# Patient Record
Sex: Male | Born: 1975 | Race: White | Hispanic: No | Marital: Single | Smoking: Current every day smoker
Health system: Southern US, Community
[De-identification: ages and names within clinical notes are randomized; demographics above are authoritative.]

---

## 2017-07-09 ENCOUNTER — Encounter (HOSPITAL_COMMUNITY): Payer: Self-pay | Admitting: Family Medicine

## 2017-07-09 ENCOUNTER — Emergency Department (HOSPITAL_COMMUNITY)
Admission: EM | Admit: 2017-07-09 | Discharge: 2017-07-09 | Disposition: A | Discharge status: Home / Self Care | Payer: Self-pay | Attending: Emergency Medicine | Admitting: Emergency Medicine

## 2017-07-09 ENCOUNTER — Emergency Department (HOSPITAL_COMMUNITY): Payer: Self-pay

## 2017-07-09 ENCOUNTER — Other Ambulatory Visit: Payer: Self-pay

## 2017-07-09 DIAGNOSIS — R030 Elevated blood-pressure reading, without diagnosis of hypertension: Secondary | ICD-10-CM

## 2017-07-09 DIAGNOSIS — S9031XA Contusion of right foot, initial encounter: Secondary | ICD-10-CM | POA: Insufficient documentation

## 2017-07-09 DIAGNOSIS — F17210 Nicotine dependence, cigarettes, uncomplicated: Secondary | ICD-10-CM | POA: Insufficient documentation

## 2017-07-09 DIAGNOSIS — Y999 Unspecified external cause status: Secondary | ICD-10-CM | POA: Insufficient documentation

## 2017-07-09 DIAGNOSIS — S9032XA Contusion of left foot, initial encounter: Secondary | ICD-10-CM | POA: Insufficient documentation

## 2017-07-09 DIAGNOSIS — Y929 Unspecified place or not applicable: Secondary | ICD-10-CM | POA: Insufficient documentation

## 2017-07-09 DIAGNOSIS — W19XXXA Unspecified fall, initial encounter: Secondary | ICD-10-CM

## 2017-07-09 DIAGNOSIS — Y939 Activity, unspecified: Secondary | ICD-10-CM | POA: Insufficient documentation

## 2017-07-09 DIAGNOSIS — W1789XA Other fall from one level to another, initial encounter: Secondary | ICD-10-CM | POA: Insufficient documentation

## 2017-07-09 DIAGNOSIS — S9030XA Contusion of unspecified foot, initial encounter: Secondary | ICD-10-CM

## 2017-07-09 NOTE — ED Triage Notes (Signed)
Patient reports on Sunday, he was jumping off a low level step/wall and fell. Patient reports he hit his head on the the wood step/wall with no LOC and experiencing bilateral ankle pain. Also, unable to bear weight on the right heel. Patient took TYLENOL for pain with last dose being last night.

## 2017-07-09 NOTE — ED Notes (Addendum)
Pt alert and oriented x 4 and is verbally responsive. Pt reports 8/10 pain to bilateral heels/ ankles pt has deformities/swelling  noted to the rt ankle. Pt reports taking tylenol for pain yesterday, pt reports that he took no medications today. Pt has increased pain with ambulation and  Unsteady gait.

## 2017-07-09 NOTE — Discharge Instructions (Signed)
Rest, Ice intermittently (in the first 24-48 hours), Gentle compression with an Ace wrap, and elevate (Limb above the level of the heart)   Take up to 800mg  of ibuprofen (that is usually 4 over the counter pills)  3 times a day for 5 days. Take with food.  Please follow with your primary care doctor in the next 5 days for high blood pressure evaluation. If you do not have a primary care doctor, present to urgent care. Reduce salt intake. Seek emergency medical care for unilateral weakness, slurring, change in vision, or chest pain and shortness of breath.

## 2017-07-09 NOTE — ED Provider Notes (Signed)
**Eugene [name] De-Identified via Obfuscation** Eugene Eugene [name]   CSN: [id number] Arrival date & time: 07/09/17  1441     History   Chief Complaint Chief Complaint  Patient presents with  . Fall    HPI   Blood pressure (!) 147/115, pulse (!) 102, temperature 97.6 F (36.4 C), temperature source Oral, resp. rate 18, height 5\' 9"  (1.753 m), weight 98.9 kg (218 lb), SpO2 96 %.  Eugene Eugene [name] is a 41 y.o. male complaining of bilateral ankle and heel pain when he jumped off a small wall several days ago he fell backwards and hit his head, there is no loss of consciousness, is not anticoagulated.  No headache, change in his vision, dysarthria, ataxia, cervicalgia, chest pain, abdominal pain he states that the pain in his ankles and feet has been moderate and exacerbated by weightbearing he is taken Tylenol at home with little relief.  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  History reviewed. No pertinent past medical history.  There are no active problems to display for this patient.   History reviewed. No pertinent surgical history.     Home Medications    Prior to Admission medications   Not on File    Family History History reviewed. No pertinent family history.  Social History Social History   Tobacco Use  . Smoking status: Current Every Day Smoker    Packs/day: 0.50    Types: Cigarettes  . Smokeless tobacco: Never Used  Substance Use Topics  . Alcohol use: Yes    Comment: 2 times a week.   . Drug use: Yes    Types: Marijuana     Allergies   Patient has no allergy information on record.   Review of Systems Review of Systems  A complete review of systems was obtained and all systems are negative except as noted in the HPI and PMH.   Physical Exam Updated Vital Signs BP (!) 147/115 (BP Location: Left Arm)   Pulse (!) 102   Temp 97.6 F (36.4 C) (Oral)   Resp 18   Ht 5\' 9"  (1.753 m)   Wt 98.9 kg (218  lb)   SpO2 96%   BMI 32.19 kg/m   Physical Exam  Constitutional: He is oriented to person, place, and time. He appears well-developed and well-nourished. No distress.  HENT:  Head: Normocephalic and atraumatic.  Mouth/Throat: Oropharynx is clear and moist.  Eyes: Conjunctivae and EOM are normal. Pupils are equal, round, and reactive to light.  Neck: Normal range of motion.  Cardiovascular: Normal rate, regular rhythm and intact distal pulses.  Pulmonary/Chest: Effort normal and breath sounds normal. No stridor. No respiratory distress. He has no wheezes. He has no rales. He exhibits no tenderness.  Abdominal: Soft. He exhibits no distension and no mass. There is no tenderness. There is no rebound and no guarding. No hernia.  Musculoskeletal: Normal range of motion.  Mild swelling to bilateral ankles, patient is diffusely tender to palpation along the bilateral ankles on the medial and lateral aspect more significantly tender along the right heel.  Distally neurovascularly intact.  Neurological: He is alert and oriented to person, place, and time.  Skin: Capillary refill takes less than 2 seconds. He is not diaphoretic.  Psychiatric: He has a normal mood and affect.  Nursing Eugene [name] and vitals reviewed.    ED Treatments / Results  Labs (all labs ordered are listed, but only abnormal results are  displayed) Labs Reviewed - No data to display  EKG  EKG Interpretation None       Radiology Dg Ankle Complete Left  Result Date: 07/09/2017 CLINICAL DATA:  Pt jumped from a low step on Sunday, fell and landed on backs of his heels and hit his head. Unable to put weight on R heel, with swelling on both ankles and feet. No previous injuries EXAM: LEFT ANKLE COMPLETE - 3+ VIEW COMPARISON:  None. FINDINGS: No convincing acute fracture. There is a small well corticated bone fragment adjacent to the inferior margin of the distal fibula consistent with an old avulsion fracture or a secondary  ossification center. The ankle mortise is normally spaced and aligned. No significant arthropathic change. Soft tissues are unremarkable. IMPRESSION: No acute fracture or dislocation. Electronically Signed   By: Amie Portlandavid  Ormond M.D.   On: 07/09/2017 16:01   Dg Ankle Complete Right  Result Date: 07/09/2017 CLINICAL DATA:  Pt jumped from a low step on Sunday, fell and landed on backs of his heels and hit his head. Unable to put weight on R heel, with swelling on both ankles and feet. No previous injuries EXAM: RIGHT ANKLE - COMPLETE 3+ VIEW COMPARISON:  None. FINDINGS: No fracture.  No bone lesion. The ankle mortise is normally spaced and aligned. No significant arthropathic change. Soft tissues are unremarkable. IMPRESSION: Negative. Electronically Signed   By: Amie Portlandavid  Ormond M.D.   On: 07/09/2017 16:02    Procedures Procedures (including critical care time)  Medications Ordered in ED Medications - No data to display   Initial Impression / Assessment and Plan / ED Course  I have reviewed the triage vital signs and the nursing notes.  Pertinent labs & imaging results that were available during my care of the patient were reviewed by me and considered in my medical decision making (see chart for details).     Vitals:   07/09/17 1526  BP: (!) 147/115  Pulse: (!) 102  Resp: 18  Temp: 97.6 F (36.4 C)  TempSrc: Oral  SpO2: 96%  Weight: 98.9 kg (218 lb)  Height: 5\' 9"  (1.753 m)    Medications - No data to display  Eugene Eugene [name] is 41 y.o. male presenting with bilateral ankle and heel pain after slipping off a short wall several days ago.  Patient is ambulatory but with pain.  Worse on the right than the left, x-rays negative.  Will write patient work Eugene [name], give crutches, recommend rest, ice, compression and elevation.  Blood pressure mildly elevated today, advise close follow-up with primary care.  Declines pain medication in the ED.  Evaluation does not show pathology that would  require ongoing emergent intervention or inpatient treatment. Pt is hemodynamically stable and mentating appropriately. Discussed findings and plan with patient/guardian, who agrees with care plan. All questions answered. Return precautions discussed and outpatient follow up given.      Final Clinical Impressions(s) / ED Diagnoses   Final diagnoses:  Fall, initial encounter  Contusion of foot, unspecified laterality, initial encounter  Elevated blood pressure reading    ED Discharge Orders    None       Cerys Winget, Mardella Laymanicole, PA-C 07/09/17 [address] [id number]    Wentz, Elliott, MD 07/11/17 [phone]

## 2019-07-08 IMAGING — CR DG ANKLE COMPLETE 3+V*L*
3 series · 3 of 3 positions shown · non-contrast
Comparison: None.

CLINICAL DATA: Pt jumped from a low step on [REDACTED], fell and landed
on backs of his heels and hit his head. Unable to put weight on R
heel, with swelling on both ankles and feet. No previous injuries

EXAM:
LEFT ANKLE COMPLETE - 3+ VIEW

[x ankle ap left]
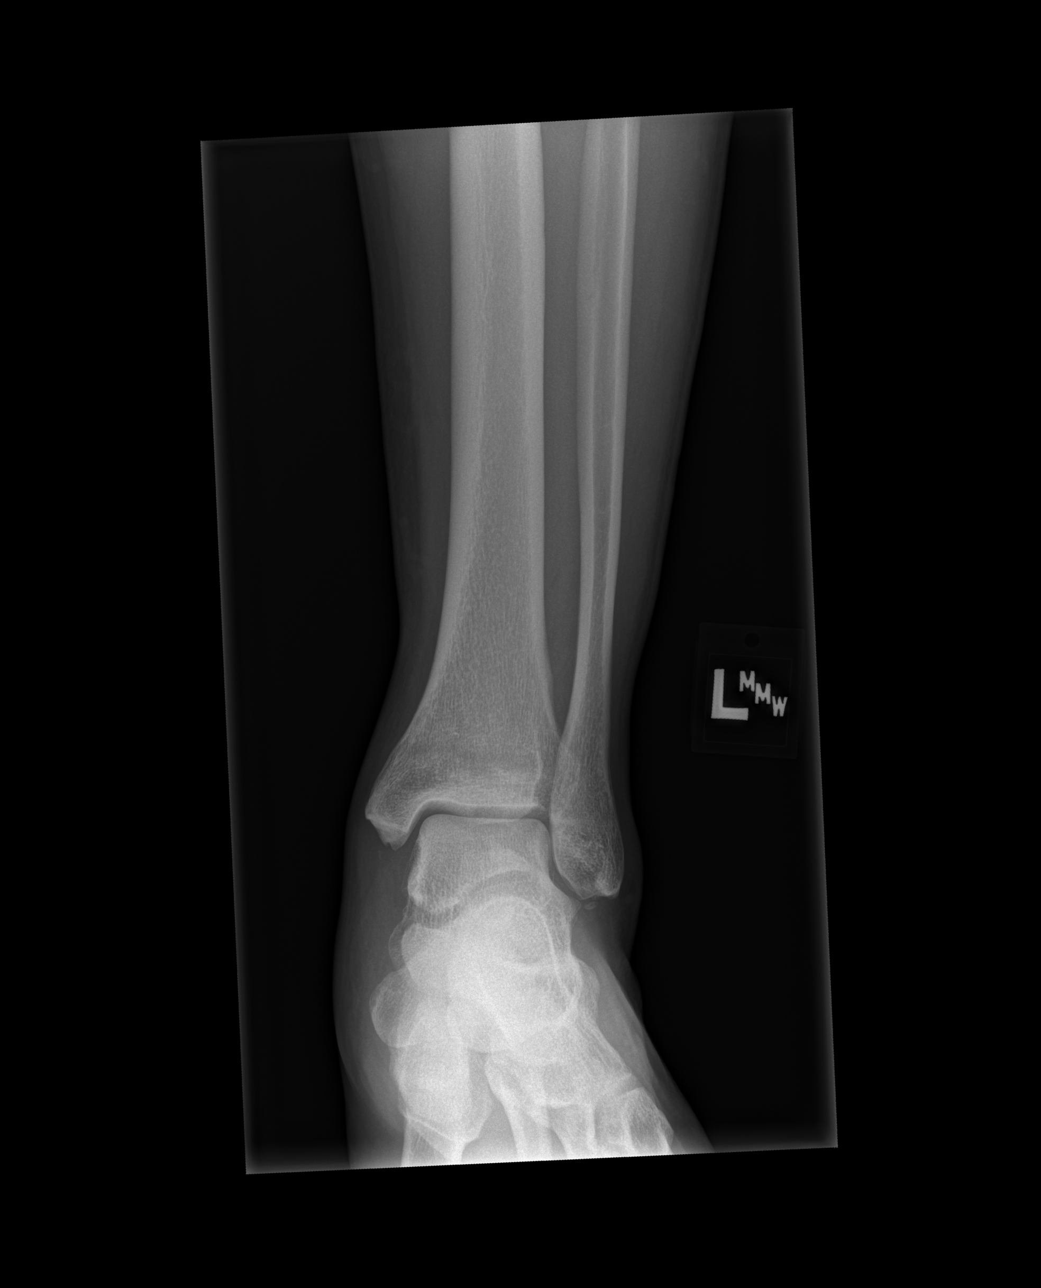

[x ankle obl left]
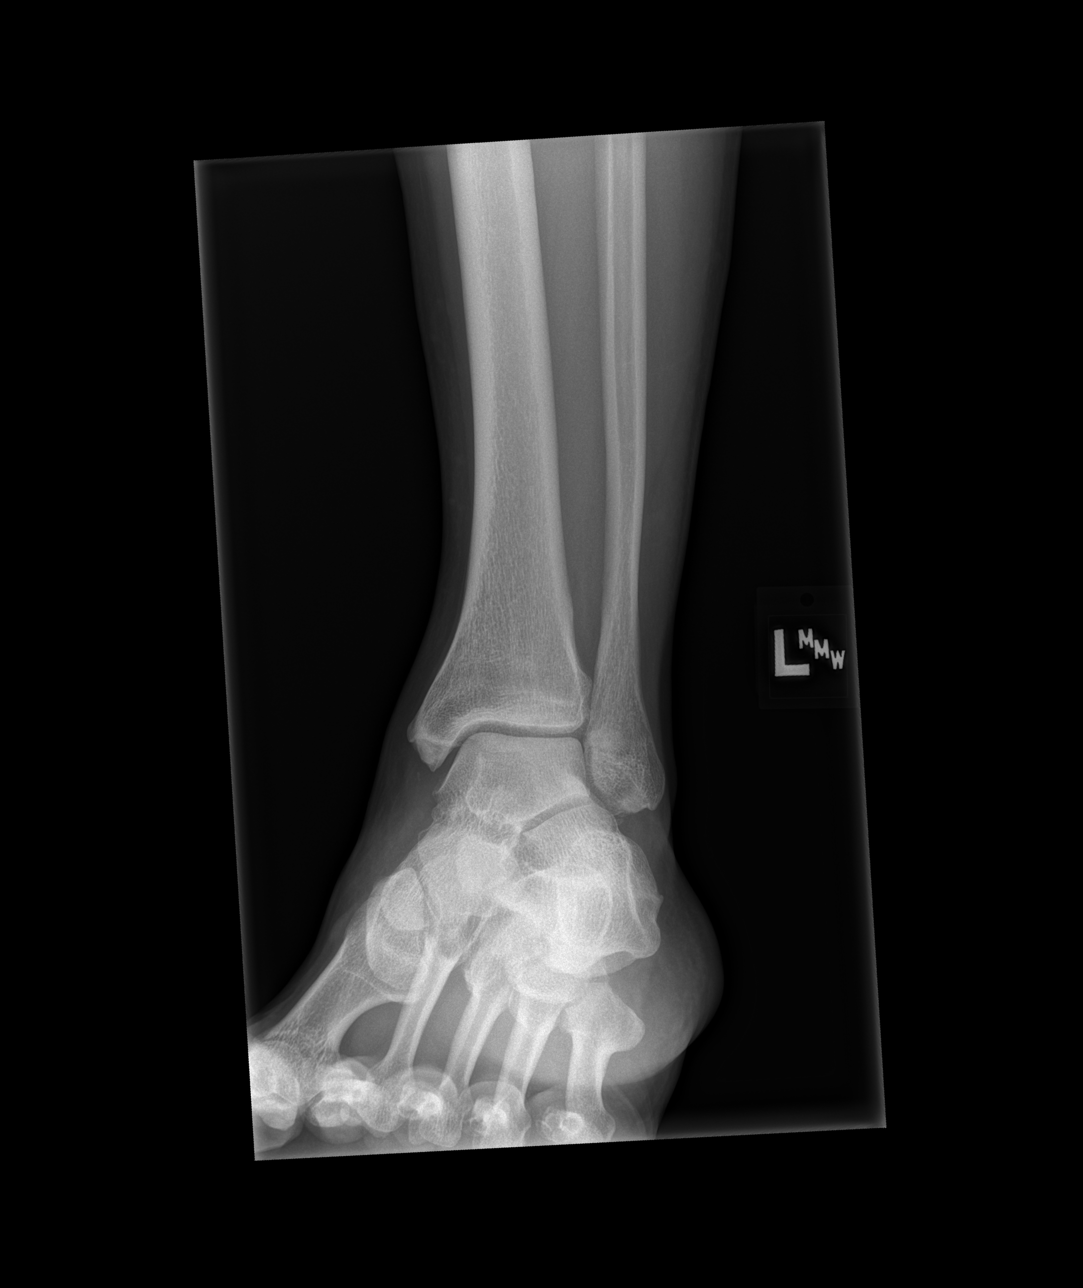

[x ankle lat left]
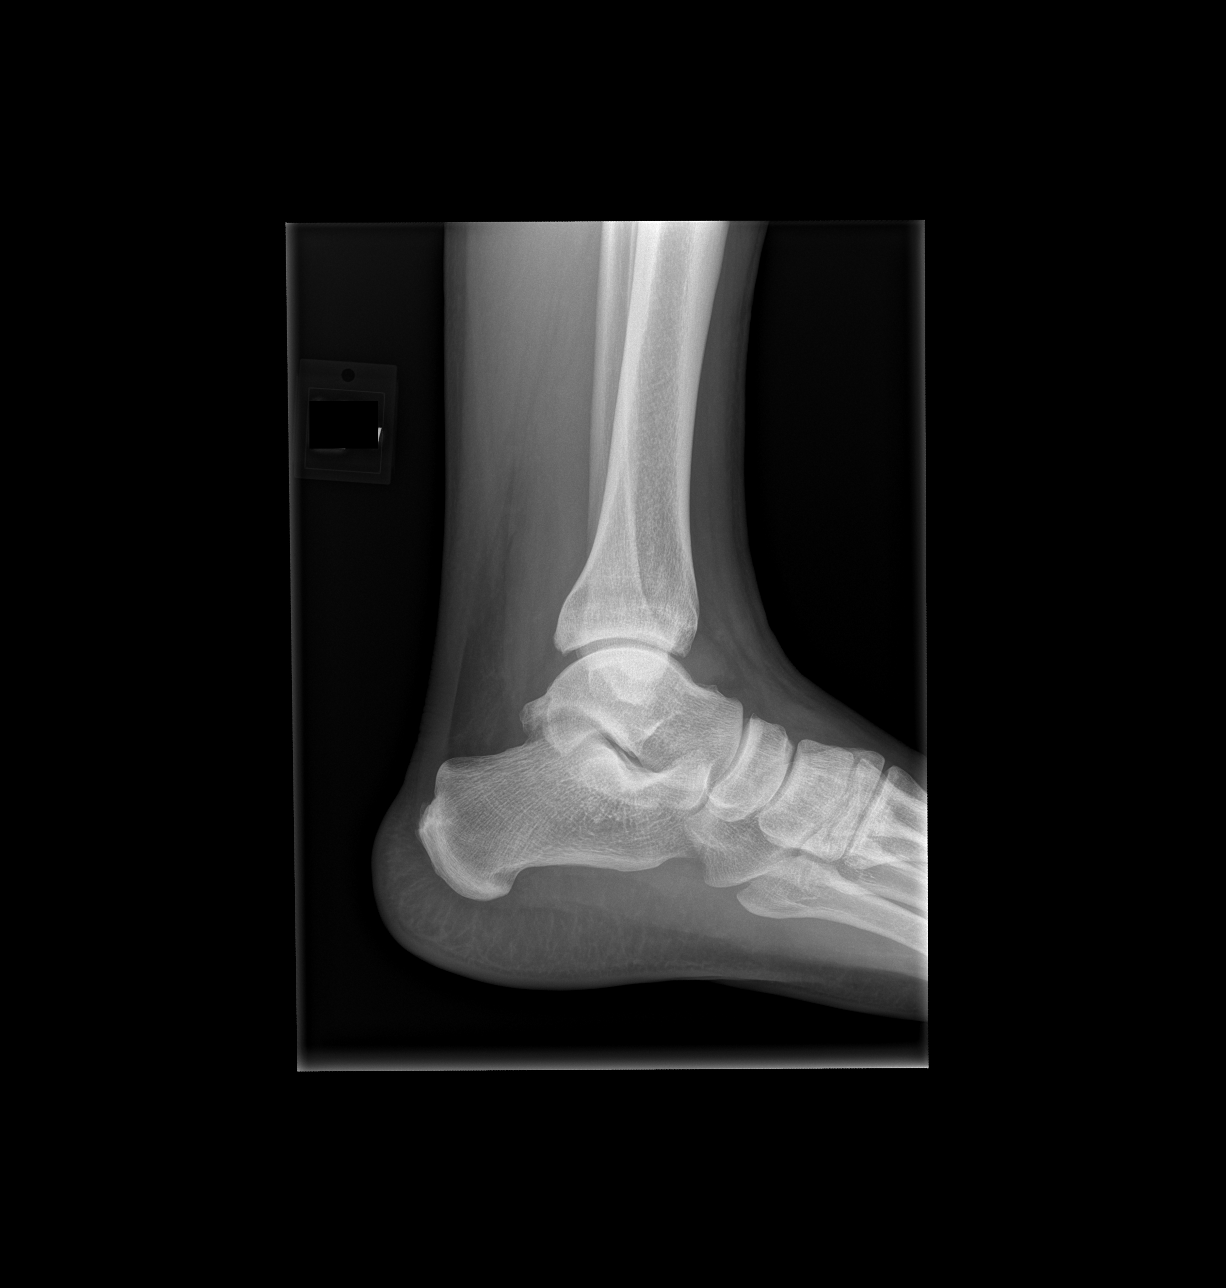

[3 of 3 positions shown; findings below may reference images not displayed]

FINDINGS: No convincing acute fracture. There is a small well corticated bone
fragment adjacent to the inferior margin of the distal fibula
consistent with an old avulsion fracture or a secondary ossification
center.

The ankle mortise is normally spaced and aligned. No significant
arthropathic change.

Soft tissues are unremarkable.
IMPRESSION: No acute fracture or dislocation.

## 2023-08-28 NOTE — Progress Notes (Signed)
 Lipids - low HDL, otherwise OK.  However, he is at a risk level where we would recommend a statin medication.  Is he OK if we send in one for him?  Also needs to focus on [facility] diet/exercise  The 10-year ASCVD risk score (Arnett DK, et al., 2019) is: 11.8%   Values used to calculate the score:     Age: 48 years     Sex: Male     Is Non-Hispanic African American: No     Diabetic: No     Tobacco smoker: Yes     Systolic Blood Pressure: 159 mmHg     Is BP treated: Yes     HDL Cholesterol: 31 mg/dL     Total Cholesterol: 151 mg/dL  CMP - slightly low sodium and chloride; use zero sugar electrolyte beverages to help bring this up.  Blood sugar mildly elevated.  Everything else OK  CBC - WBC count, hematocrit, neutrophils, and monocytes elevated; have him monitor for signs of infection, such as fever/chills/body aches.  However, these labs appear to be stable from previous.  Please mske sure he is also staying well hydrated.

## 2023-08-28 NOTE — Progress Notes (Addendum)
 [facility] [place]  Health Maintenance Visit Eugene [name] DOB: 09/09/1975  MRN: [id number] Visit Date: 08/28/2023  Encounter Provider: Bernardino JAYSON Level, FNP  HPI HEALTH MAINTENANCE: Eugene [name] presents for a health maintenance exam as a new patient.  Seasonal worker - goes to [place]  seasonally for work.  Works as a leisure centre manager.  Married - no kids SCREENING TESTS: ---Last physical exam: Over 1 year ago ---Last colonoscopy: Never - declines colonoscopy, but agreeable to Cologuard ---Last PSA: N/A ---Last dentist visit: Not recently, but has appt coming up ---Vision problems: Denies - does not see eye doctor ---Blood pressure problems: HTN - managed with medication LIFESTYLE:  ---Sleep hours: 8 hours, but not continuously  ---Seatbelt use: always ---Diet: Fair - decreasing fast food and sodium.  Eats smaller portions. ---Exercise: Yardwork - no regimen ---Alcohol: 4-5 beers twice a week ---Smoking/Vaping: 0.5 PPD - smoked about 1 PPD for over 30 years ---Caffeine: no coffee, 1 soda per day [facility]), no energy drinks LAB/IMMUNIZATIONS: ---Last lipid profile: Today  ---Last tetanus vaccine: Believes he had one 3 years - will check date. ---Last influenza vaccine: Declines ---COVID vaccine: Declines ---Last pneumococcal vaccine: N/A - due age 69 ---Shingrix vaccine: N/A - due age 36 MEDICATIONS: OTC Vitamins: B12 Other OTC Meds: Denies  Family history of prostate cancer: Denies.  Family history of colon cancer: Denies.  Family history of premature death from CVD: Father (age 54) and paternal grandfather (age 27)  Family history of hyperlipidemia or hypertension: Father's side of family (HTN and HLD).  HYPERTENSION MANAGEMENT: He is here for hypertension follow-up. SYMPTOMS: Pertinent negatives - chest pain, chest pressure/discomfort, claudication, dyspnea, exertional chest pressure/discomfort, fatigue, irregular heart beat, lower extremity  edema, near-syncope, orthopnea, palpitations, paroxysmal nocturnal dyspnea, syncope, and tachypnea HABITS: --Patient has been following a reduced sodium diet. -- He IS NOT getting adequate exercise. HOME BLOOD PRESSURE: DOES NOT check BP at home. MEDICATIONS: Losartan 100 mg daily (has been without medicine for 2 weeks - needs refill) RISK FACTORS: Smoking: 0.5 PPD (decreased from 1 PPD) - using Nicorette Gum Exercise: Yardwork - no specific regimen  Possible OSA - snoring and gasping for air throughout the night for a long time.  Never diagnosed with OSA formally.  Does not use CPAP.  Does have daytime fatigue.  He is interested in a sleep study.   ROS-  Review of Systems  Constitutional:  Positive for fatigue (daytime). Negative for activity change, appetite change, chills, diaphoresis and fever.  HENT:  Negative for congestion, ear pain, hearing loss, sinus pressure, sneezing, sore throat and trouble swallowing.   Eyes:  Negative for photophobia and visual disturbance.  Respiratory:  Negative for cough, chest tightness, shortness of breath and wheezing.   Cardiovascular:  Negative for chest pain, palpitations and leg swelling.  Gastrointestinal:  Negative for abdominal distention, abdominal pain, constipation, diarrhea, nausea and vomiting.  Endocrine: Negative for cold intolerance and heat intolerance.  Genitourinary:  Negative for difficulty urinating, dysuria and hematuria.  Musculoskeletal:  Negative for arthralgias, back pain, joint swelling and myalgias.  Neurological:  Negative for dizziness, light-headedness, numbness and headaches.  Hematological:  Negative for adenopathy. Does not bruise/bleed easily.  Psychiatric/Behavioral:  Positive for sleep disturbance (snoring). Negative for agitation, behavioral problems, confusion and decreased concentration. The patient is not nervous/anxious.     Medical History:  Past Medical History:  Diagnosis Date  . Hypertension     There  are no active problems to display for this patient.  Current Medications:  Current Outpatient Medications on File Prior to Visit  Medication Sig Dispense Refill  . aspirin 325 mg tablet Take 325 mg by mouth daily.    SABRA losartan (COZAAR) 50 mg tablet Take 100 mg by mouth daily.     No current facility-administered medications on file prior to visit.    Current Outpatient Medications:  .  aspirin 325 mg tablet, Take 325 mg by mouth daily., Disp: , Rfl:  .  losartan (COZAAR) 50 mg tablet, Take 100 mg by mouth daily., Disp: , Rfl:   Allergies: No Known Allergies   Immunizations:  There is no immunization history on file for this patient.  Surgical History- History reviewed. No pertinent surgical history.  Family history- No family history on file. Social history-  Social History   Socioeconomic History  . Marital status: Married    Spouse name: None  . Number of children: None  . Years of education: None  . Highest education level: None  Occupational History  . None  Tobacco Use  . Smoking status: Every Day    Current packs/day: 0.50    Types: Cigarettes  . Smokeless tobacco: Never  Substance and Sexual Activity  . Alcohol use: Yes  . Drug use: Yes    Types: Marijuana  . Sexual activity: None  Other Topics Concern  . None  Social History Narrative  . None   Social Drivers of Health   Food Insecurity: Low Risk  (08/28/2023)   Food vital sign   . Within the past 12 months, you worried that your food would run out before you got money to buy more: Never true   . Within the past 12 months, the food you bought just didn't last and you didn't have money to get more: Never true  Transportation Needs: No Transportation Needs (08/28/2023)   Transportation   . In the past 12 months, has lack of reliable transportation kept you from medical appointments, meetings, work or from getting things needed for daily living? : No  Safety: Low Risk  (08/28/2023)   Safety   . How often  does anyone, including family and friends, physically hurt you?: Never   . How often does anyone, including family and friends, insult or talk down to you?: Never   . How often does anyone, including family and friends, threaten you with harm?: Never   . How often does anyone, including family and friends, scream or curse at you?: Never  Living Situation: Low Risk  (08/28/2023)   Living Situation   . What is your living situation today?: I have a steady place to live   . Think about the place you live. Do you have problems with any of the following? Choose all that apply:: None/None on this list     Physical Exam: BP 159/89   Pulse (!) 116   Temp 97.9 F (36.6 C)   Resp 16   Ht 1.753 m (5' 9)   Wt 125 kg (274 lb 12.8 oz)   SpO2 94%   BMI 40.58 kg/m  Wt Readings from Last 3 Encounters:  08/28/23 125 kg (274 lb 12.8 oz)   Physical Exam Vitals and nursing note reviewed.  Constitutional:      Appearance: Normal appearance. He is obese.  HENT:     Head: Normocephalic and atraumatic.     Right Ear: Tympanic membrane, ear canal and external ear normal.     Left Ear: Tympanic membrane, ear canal and external  ear normal.     Nose: Nose normal.     Mouth/Throat:     Mouth: Mucous membranes are moist.     Pharynx: Oropharynx is clear.  Eyes:     Extraocular Movements: Extraocular movements intact.     Pupils: Pupils are equal, round, and reactive to light.  Neck:     Vascular: No carotid bruit.  Cardiovascular:     Rate and Rhythm: Normal rate and regular rhythm.     Pulses: Normal pulses.     Heart sounds: Normal heart sounds.  Pulmonary:     Effort: Pulmonary effort is normal.     Breath sounds: Wheezing (intermittent) and rhonchi (intermittent) present.  Abdominal:     General: Abdomen is flat. Bowel sounds are normal.     Palpations: Abdomen is soft.  Musculoskeletal:        General: Normal range of motion.     Cervical back: Normal range of motion and neck supple.      Comments: STRENGTH 5/5 BILATERALLY IN UPPER AND LOWER EXTREMITIES  Lymphadenopathy:     Cervical: No cervical adenopathy.  Skin:    General: Skin is warm and dry.  Neurological:     General: No focal deficit present.     Mental Status: He is alert and oriented to person, place, and time.  Psychiatric:        Mood and Affect: Mood normal.        Behavior: Behavior normal.      Assessment and Plan:  1. Encounter for annual physical exam (Primary) Full CPE and BW panel performed today - discussed principles of a healthy lifestyle.  He will work on quitting smoking and continuing to decrease his intake of fast food.  He knows he needs to exercise more as well. - CBC with Differential - Lipid Panel - Comprehensive Metabolic Panel  2. Essential hypertension BP elevated today, but has been out of medication - Losartan refilled. - CBC with Differential - Comprehensive Metabolic Panel - losartan (COZAAR) 50 mg tablet; Take 2 tablets (100 mg total) by mouth daily.  Dispense: 180 tablet; Refill: 1  3. Snoring Will refer to Pulm for possible OSA based on presentation - Ambulatory referral to Pulmonology; Future  4. Class 3 severe obesity without serious comorbidity with body mass index (BMI) of 40.0 to 44.9 in adult, unspecified obesity type (CMD) Discussed HH diet/exercise - he will continue to work on this  5. Screening for colon cancer Due for screening - declines colonoscopy, but agreeable to Cologuard  - Cologuard colon cancer screening  6. Lipid screening Due for BW - discussed HH diet/exercise - Lipid Panel  Problem List Items Addressed This Visit   None Visit Diagnoses       Encounter for annual physical exam    -  Primary   Relevant Orders   CBC with Differential   Lipid Panel   Comprehensive Metabolic Panel     Essential hypertension       Relevant Medications   losartan (COZAAR) 50 mg tablet   Other Relevant Orders   CBC with Differential   Comprehensive  Metabolic Panel     Snoring       Relevant Orders   Ambulatory referral to Pulmonology     Class 3 severe obesity without serious comorbidity with body mass index (BMI) of 40.0 to 44.9 in adult, unspecified obesity type (CMD)         Screening for colon cancer  Relevant Orders   Cologuard colon cancer screening     Lipid screening       Relevant Orders   Lipid Panel      RTC in 6 months for F/U - sooner if needed  No follow-ups on file.   There are no Patient Instructions on file for this visit.   Please contact my office for worsening conditions or problems, and seek emergency medical treatment and/or call 911 if you or your family deems either necessary.  This document was created with the assistance of Dragon voice recognition software.  I have personally spent 20 minutes (in addition to CPE) involved in face-to-face and non-face-to-face activities for this patient on the day of the visit.  Professional time spent includes the following activities, in addition to those noted in the documentation:  - preparing to see the patient (e.g., review of recent and/or remote lab/imaging/study results, provider notes, and patient messages/phone calls available in current EMR, CareEverywhere, and scanned records) -obtaining and/or reviewing separately obtained history either through past provider notes, patient phone calls, and/or patient's family member(s)/caregiver(s) -performing a medically appropriate examination and/or evaluation -counseling and educating the patient/family/caregiver -ordering medications, tests, or procedures -documenting clinical information in the electronic or other health record -reviewing most up to date studies or expert consensus guidelines for screening/diagnosing/treating pertinent conditions/symptoms -independently interpreting results (not separately reported) and communicating results to the patient/family/caregiver -care coordination (not separately  reported) -referring and communicating with other health care professionals (when not separately reported)  Electronically signed by: Bernardino JAYSON Level, FNP 08/28/2023 4:38 PM

## 2024-05-21 ENCOUNTER — Ambulatory Visit (HOSPITAL_COMMUNITY)
Admission: EM | Admit: 2024-05-21 | Discharge: 2024-05-21 | Disposition: A | Discharge status: Other Acute Inpatient Hospital

## 2024-05-21 ENCOUNTER — Other Ambulatory Visit (HOSPITAL_COMMUNITY)
Admission: EM | Admit: 2024-05-21 | Discharge: 2024-05-24 | Disposition: A | Source: Intra-hospital | Attending: Psychiatry | Admitting: Psychiatry

## 2024-05-21 DIAGNOSIS — F1020 Alcohol dependence, uncomplicated: Secondary | ICD-10-CM | POA: Insufficient documentation

## 2024-05-21 DIAGNOSIS — F32A Depression, unspecified: Secondary | ICD-10-CM

## 2024-05-21 DIAGNOSIS — F1290 Cannabis use, unspecified, uncomplicated: Secondary | ICD-10-CM

## 2024-05-21 DIAGNOSIS — F439 Reaction to severe stress, unspecified: Secondary | ICD-10-CM

## 2024-05-21 DIAGNOSIS — R7303 Prediabetes: Secondary | ICD-10-CM | POA: Insufficient documentation

## 2024-05-21 DIAGNOSIS — Z5900 Homelessness unspecified: Secondary | ICD-10-CM

## 2024-05-21 DIAGNOSIS — F322 Major depressive disorder, single episode, severe without psychotic features: Secondary | ICD-10-CM | POA: Insufficient documentation

## 2024-05-21 DIAGNOSIS — R45851 Suicidal ideations: Secondary | ICD-10-CM

## 2024-05-21 LAB — CBC WITH DIFFERENTIAL/PLATELET
Abs Immature Granulocytes: 0.23 K/uL — ABNORMAL HIGH (ref 0.00–0.07)
Basophils Absolute: 0.2 K/uL — ABNORMAL HIGH (ref 0.0–0.1)
Basophils Relative: 1 %
Eosinophils Absolute: 0.1 K/uL (ref 0.0–0.5)
Eosinophils Relative: 1 %
HCT: 56.3 % — ABNORMAL HIGH (ref 39.0–52.0)
Hemoglobin: 18.7 g/dL — ABNORMAL HIGH (ref 13.0–17.0)
Immature Granulocytes: 1 %
Lymphocytes Relative: 20 %
Lymphs Abs: 3.5 K/uL (ref 0.7–4.0)
MCH: 29.4 pg (ref 26.0–34.0)
MCHC: 33.2 g/dL (ref 30.0–36.0)
MCV: 88.7 fL (ref 80.0–100.0)
Monocytes Absolute: 0.9 K/uL (ref 0.1–1.0)
Monocytes Relative: 5 %
Neutro Abs: 12.9 K/uL — ABNORMAL HIGH (ref 1.7–7.7)
Neutrophils Relative %: 72 %
Platelets: 388 K/uL (ref 150–400)
RBC: 6.35 MIL/uL — ABNORMAL HIGH (ref 4.22–5.81)
RDW: 13.8 % (ref 11.5–15.5)
WBC: 17.8 K/uL — ABNORMAL HIGH (ref 4.0–10.5)
nRBC: 0 % (ref 0.0–0.2)

## 2024-05-21 LAB — COMPREHENSIVE METABOLIC PANEL WITH GFR
ALT: 48 U/L — ABNORMAL HIGH (ref 0–44)
AST: 33 U/L (ref 15–41)
Albumin: 3.9 g/dL (ref 3.5–5.0)
Alkaline Phosphatase: 94 U/L (ref 38–126)
Anion gap: 16 — ABNORMAL HIGH (ref 5–15)
BUN: 12 mg/dL (ref 6–20)
CO2: 20 mmol/L — ABNORMAL LOW (ref 22–32)
Calcium: 9.1 mg/dL (ref 8.9–10.3)
Chloride: 96 mmol/L — ABNORMAL LOW (ref 98–111)
Creatinine, Ser: 0.92 mg/dL (ref 0.61–1.24)
GFR, Estimated: >60 mL/min (ref >60)
Glucose, Bld: 85 mg/dL (ref 70–99)
Potassium: 4.2 mmol/L (ref 3.5–5.1)
Sodium: 132 mmol/L — ABNORMAL LOW (ref 135–145)
Total Bilirubin: 0.7 mg/dL (ref 0.0–1.2)
Total Protein: 7.4 g/dL (ref 6.5–8.1)

## 2024-05-21 LAB — POCT URINE DRUG SCREEN - MANUAL ENTRY (I-SCREEN)
POC Amphetamine UR: NOT DETECTED
POC Buprenorphine (BUP): NOT DETECTED
POC Cocaine UR: NOT DETECTED
POC Marijuana UR: POSITIVE — AB
POC Methadone UR: NOT DETECTED
POC Methamphetamine UR: NOT DETECTED
POC Morphine: NOT DETECTED
POC Oxazepam (BZO): NOT DETECTED
POC Oxycodone UR: NOT DETECTED
POC Secobarbital (BAR): NOT DETECTED

## 2024-05-21 LAB — HEMOGLOBIN A1C
Hgb A1c MFr Bld: 6.1 % — ABNORMAL HIGH (ref 4.8–5.6)
Mean Plasma Glucose: 128.37 mg/dL

## 2024-05-21 LAB — LIPID PANEL
Cholesterol: 189 mg/dL (ref 0–200)
HDL: 34 mg/dL — ABNORMAL LOW (ref >40)
LDL Cholesterol: 118 mg/dL — ABNORMAL HIGH (ref 0–99)
Total CHOL/HDL Ratio: 5.6 ratio
Triglycerides: 184 mg/dL — ABNORMAL HIGH (ref <150)
VLDL: 37 mg/dL (ref 0–40)

## 2024-05-21 LAB — ETHANOL: Alcohol, Ethyl (B): 139 mg/dL — ABNORMAL HIGH (ref <15)

## 2024-05-21 LAB — TSH: TSH: 1.704 u[IU]/mL (ref 0.350–4.500)

## 2024-05-21 MED ORDER — DIPHENHYDRAMINE HCL 50 MG PO CAPS: 50.0000 mg | ORAL_CAPSULE | Freq: Three times a day (TID) | ORAL | Status: DC | PRN

## 2024-05-21 MED ORDER — THIAMINE HCL 100 MG/ML IJ SOLN
100.0000 mg | Freq: Once | INTRAMUSCULAR | Status: AC
  Administered 2024-05-21: 100 mg via INTRAMUSCULAR
  Filled 2024-05-21: qty 2

## 2024-05-21 MED ORDER — CHLORDIAZEPOXIDE HCL 25 MG PO CAPS: 25.0000 mg | ORAL_CAPSULE | Freq: Every day | ORAL | Status: DC

## 2024-05-21 MED ORDER — DIPHENHYDRAMINE HCL 50 MG/ML IJ SOLN: 50.0000 mg | Freq: Three times a day (TID) | INTRAMUSCULAR | Status: DC | PRN

## 2024-05-21 MED ORDER — LOSARTAN POTASSIUM 50 MG PO TABS
50.0000 mg | ORAL_TABLET | Freq: Two times a day (BID) | ORAL | Status: DC
  Administered 2024-05-21 – 2024-05-24 (×7): 50 mg via ORAL
  Filled 2024-05-21 (×7): qty 1

## 2024-05-21 MED ORDER — HALOPERIDOL 5 MG PO TABS: 5.0000 mg | ORAL_TABLET | Freq: Three times a day (TID) | ORAL | Status: DC | PRN

## 2024-05-21 MED ORDER — LOPERAMIDE HCL 2 MG PO CAPS: 2.0000 mg | ORAL_CAPSULE | ORAL | Status: DC | PRN

## 2024-05-21 MED ORDER — HALOPERIDOL LACTATE 5 MG/ML IJ SOLN: 10.0000 mg | Freq: Three times a day (TID) | INTRAMUSCULAR | Status: DC | PRN

## 2024-05-21 MED ORDER — CHLORDIAZEPOXIDE HCL 25 MG PO CAPS
25.0000 mg | ORAL_CAPSULE | Freq: Four times a day (QID) | ORAL | Status: AC
  Administered 2024-05-21 – 2024-05-23 (×6): 25 mg via ORAL
  Filled 2024-05-21 (×6): qty 1

## 2024-05-21 MED ORDER — LORAZEPAM 2 MG/ML IJ SOLN: 2.0000 mg | Freq: Three times a day (TID) | INTRAMUSCULAR | Status: DC | PRN

## 2024-05-21 MED ORDER — CHLORDIAZEPOXIDE HCL 25 MG PO CAPS
25.0000 mg | ORAL_CAPSULE | Freq: Three times a day (TID) | ORAL | Status: AC
  Administered 2024-05-23 – 2024-05-24 (×3): 25 mg via ORAL
  Filled 2024-05-21 (×3): qty 1

## 2024-05-21 MED ORDER — HYDROXYZINE HCL 25 MG PO TABS
25.0000 mg | ORAL_TABLET | Freq: Four times a day (QID) | ORAL | Status: DC | PRN
  Administered 2024-05-22: 25 mg via ORAL
  Filled 2024-05-21: qty 1

## 2024-05-21 MED ORDER — ADULT MULTIVITAMIN W/MINERALS CH
1.0000 | ORAL_TABLET | Freq: Every day | ORAL | Status: DC
  Administered 2024-05-21 – 2024-05-24 (×4): 1 via ORAL
  Filled 2024-05-21 (×4): qty 1

## 2024-05-21 MED ORDER — ASPIRIN 325 MG PO TABS
325.0000 mg | ORAL_TABLET | Freq: Every day | ORAL | Status: DC
  Administered 2024-05-22 – 2024-05-24 (×3): 325 mg via ORAL
  Filled 2024-05-21 (×3): qty 1

## 2024-05-21 MED ORDER — HALOPERIDOL LACTATE 5 MG/ML IJ SOLN: 5.0000 mg | Freq: Three times a day (TID) | INTRAMUSCULAR | Status: DC | PRN

## 2024-05-21 MED ORDER — TRAZODONE HCL 50 MG PO TABS
50.0000 mg | ORAL_TABLET | Freq: Every evening | ORAL | Status: DC | PRN
  Administered 2024-05-22: 50 mg via ORAL
  Filled 2024-05-21: qty 1

## 2024-05-21 MED ORDER — CHLORDIAZEPOXIDE HCL 25 MG PO CAPS: 25.0000 mg | ORAL_CAPSULE | Freq: Four times a day (QID) | ORAL | Status: DC | PRN

## 2024-05-21 MED ORDER — CHLORDIAZEPOXIDE HCL 25 MG PO CAPS: 25.0000 mg | ORAL_CAPSULE | ORAL | Status: DC

## 2024-05-21 MED ORDER — ACETAMINOPHEN 325 MG PO TABS: 650.0000 mg | ORAL_TABLET | Freq: Four times a day (QID) | ORAL | Status: DC | PRN

## 2024-05-21 MED ORDER — ONDANSETRON 4 MG PO TBDP: 4.0000 mg | ORAL_TABLET | Freq: Four times a day (QID) | ORAL | Status: DC | PRN

## 2024-05-21 MED ORDER — MAGNESIUM HYDROXIDE 400 MG/5ML PO SUSP: 30.0000 mL | Freq: Every day | ORAL | Status: DC | PRN

## 2024-05-21 MED ORDER — ALUM & MAG HYDROXIDE-SIMETH 200-200-20 MG/5ML PO SUSP: 30.0000 mL | ORAL | Status: DC | PRN
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
## 2024-05-21 NOTE — ED Provider Notes (Addendum)
 Facility Based Crisis Admission H&P  Date: 05/22/24 Patient Name: Eugene [name] MRN: [id number] Chief Complaint: I'm thinking about taking my life.  Diagnoses:  Final diagnoses:  Alcohol use disorder, severe, dependence (HCC)  Suicidal ideations  Depression, unspecified depression type  Marijuana user  Homelessness unspecified  Stress    HPI: Eugene [name] is a 48 year old male with no documented psychiatric history, who presented voluntarily to [facility] via [facility] due to SI,requesting for mental health assistance and alcohol detox treatment.  Patient was seen face-to-face by this provider and chart reviewed.  On presentation, patient is calm, sober and emotional.  He reports I'm thinking about taking my life because I don't have anything left. I'm just wasting time on earth, I lost my house, my wife, and I'm an alcoholic. I don't know if I could fix it. I want help, but I don't know how to get it. I called the police and told them I needed help.  Patient becomes very tearful at this point.  Patient is reassured that he is at the right place and will get the help that he needs.  Patient reports he had thoughts, of walking into the highway because it was just there, a few feet away from the house and it made sense but  I don't wake up daily thinking I want to kill myself, today I do, it was a bad day.  Patient endorses suicidal ideation but strongly denies having a plan, stating walking into the highway was just a thought, but he called the police.  Patient reports being an alcoholic for 33 years and states I don't drink much liquor, I drink a whole lot of beer and I smoke weed.  Patient reports he consumes about 18 cans of beer a day, 5 out of 7 days a week.  He denies alcohol withdrawal seizures and states I'm not sober enough to experience withdrawals.  His last drink was 4 hours ago before presenting to the [facility].   He endorses smoking weed daily and states I smoke a  gram every 2 days or so.  Patient identifies his current stressors as having no meaningful relationships and states I'm all alone and don't have any family, and my wife don't want me, my parents are dead, I got a brother but there's no relationship.  Patient reports he is not established with an outpatient psychiatrist or therapist stating he lost his insurance a few months ago.  Patient denies a history of suicide attempt.  He identifies his drinking habits of 18 beers a day as self-harm.  He denies access to a gun.  Patient completed the PHQ-9 questionnaire and obtained a total score of 27, indicating severe major depression.   Patient is seeking treatment for his depression and detox from alcohol abuse.  On evaluation, patient is alert, oriented x 3, and cooperative. Speech is clear and coherent. Pt appears appropriately dressed. Eye contact is fair. Mood is anxious and depressed, affect is tearful and congruent with mood. Thought process is coherent and goal directed and thought content is WDL. Pt endorses SI, denies plan. Denies HI/AVH. There is no objective indication that the patient is responding to internal stimuli. No delusions elicited during this assessment.   Discussed recommendation for admission to [facility] for alcohol detox/treatment and mood stabilization. Patient is provided with opportunity for questions.  He verbalized understanding and is in agreement.  PHQ 2-9:  Flowsheet Row ED from 05/21/2024 in [facility]  Thoughts that you  would be better off dead, or of hurting yourself in some way Nearly every day  PHQ-9 Total Score 27    Flowsheet Row ED from 05/21/2024 in [facility]  C-SSRS RISK CATEGORY No Risk    Screenings    Flowsheet Row Most Recent Value  CIWA-Ar Total 1    Total Time spent with patient: 30 minutes  Musculoskeletal  Strength & Muscle Tone: within normal limits Gait & Station:  normal Patient leans: N/A  Psychiatric Specialty Exam  Presentation General Appearance:  Appropriate for Environment  Eye Contact: Fair  Speech: Clear and Coherent  Speech Volume: Normal  Handedness: Right   Mood and Affect  Mood: Depressed; Anxious  Affect: Congruent; Tearful   Thought Process  Thought Processes: Coherent  Descriptions of Associations:Intact  Orientation:Full (Time, Place and Person)  Thought Content:WDL    Hallucinations:Hallucinations: None  Ideas of Reference:None  Suicidal Thoughts:Suicidal Thoughts: Yes, Active SI Active Intent and/or Plan: Without Plan  Homicidal Thoughts:Homicidal Thoughts: No   Sensorium  Memory: Immediate Good  Judgment: Poor  Insight: Good   Executive Functions  Concentration: Good  Attention Span: Good  Recall: Good  Fund of Knowledge: Good  Language: Good   Psychomotor Activity  Psychomotor Activity: Psychomotor Activity: Normal   Assets  Assets: Communication Skills; Desire for Improvement   Sleep  Sleep: Sleep: Fair   Nutritional Assessment (For OBS and FBC admissions only) Has the patient had a weight loss or gain of 10 pounds or more in the last 3 months?: No Has the patient had a decrease in food intake/or appetite?: No Does the patient have dental problems?: No Does the patient have eating habits or behaviors that may be indicators of an eating disorder including binging or inducing vomiting?: No Has the patient recently lost weight without trying?: 0 Has the patient been eating poorly because of a decreased appetite?: 0 Malnutrition Screening Tool Score: 0    Physical Exam Constitutional:      General: He is not in acute distress.    Appearance: He is not diaphoretic.  HENT:     Nose: No congestion.  Pulmonary:     Effort: No respiratory distress.  Chest:     Chest wall: No tenderness.  Neurological:     Mental Status: He is alert and oriented to  person, place, and time.  Psychiatric:        Attention and Perception: Attention and perception normal.        Mood and Affect: Mood is anxious and depressed. Affect is tearful.        Speech: Speech normal.        Behavior: Behavior is cooperative.        Thought Content: Thought content includes suicidal ideation.    Review of Systems  Constitutional:  Negative for chills, diaphoresis and fever.  HENT:  Negative for congestion.   Eyes:  Negative for discharge.  Respiratory:  Negative for cough, shortness of breath and wheezing.   Cardiovascular:  Negative for chest pain and palpitations.  Gastrointestinal:  Negative for diarrhea, nausea and vomiting.  Neurological:  Negative for dizziness, seizures, loss of consciousness, weakness and headaches.  Psychiatric/Behavioral:  Positive for depression, substance abuse and suicidal ideas. The patient is nervous/anxious.     Past Psychiatric History: See H & P   Is the patient at risk to self? Yes  Has the patient been a risk to self in the past 6 months? Yes .  Has the patient been a risk to self within the distant past? Yes   Is the patient a risk to others? No   Has the patient been a risk to others in the past 6 months? No   Has the patient been a risk to others within the distant past? No   Past Medical History: See Chart Family History: N/A Social History: N/A  Last Labs:  Admission on 05/21/2024  Component Date Value Ref Range Status   WBC 05/21/2024 17.8 (H)  4.0 - 10.5 K/uL Final   RBC 05/21/2024 6.35 (H)  4.22 - 5.81 MIL/uL Final   Hemoglobin 05/21/2024 18.7 (H)  13.0 - 17.0 g/dL Final   HCT 89/69/7974 56.3 (H)  39.0 - 52.0 % Final   MCV 05/21/2024 88.7  80.0 - 100.0 fL Final   MCH 05/21/2024 29.4  26.0 - 34.0 pg Final   MCHC 05/21/2024 33.2  30.0 - 36.0 g/dL Final   RDW 89/69/7974 13.8  11.5 - 15.5 % Final   Platelets 05/21/2024 388  150 - 400 K/uL Final   nRBC 05/21/2024 0.0  0.0 - 0.2 % Final   Neutrophils  Relative % 05/21/2024 72  % Final   Neutro Abs 05/21/2024 12.9 (H)  1.7 - 7.7 K/uL Final   Lymphocytes Relative 05/21/2024 20  % Final   Lymphs Abs 05/21/2024 3.5  0.7 - 4.0 K/uL Final   Monocytes Relative 05/21/2024 5  % Final   Monocytes Absolute 05/21/2024 0.9  0.1 - 1.0 K/uL Final   Eosinophils Relative 05/21/2024 1  % Final   Eosinophils Absolute 05/21/2024 0.1  0.0 - 0.5 K/uL Final   Basophils Relative 05/21/2024 1  % Final   Basophils Absolute 05/21/2024 0.2 (H)  0.0 - 0.1 K/uL Final   Immature Granulocytes 05/21/2024 1  % Final   Abs Immature Granulocytes 05/21/2024 0.23 (H)  0.00 - 0.07 K/uL Final   Performed at [facility], [address], [place], [place] [address]   Sodium 05/21/2024 132 (L)  135 - 145 mmol/L Final   Potassium 05/21/2024 4.2  3.5 - 5.1 mmol/L Final   Chloride 05/21/2024 96 (L)  98 - 111 mmol/L Final   CO2 05/21/2024 20 (L)  22 - 32 mmol/L Final   Glucose, Bld 05/21/2024 85  70 - 99 mg/dL Final   Glucose reference range applies only to samples taken after fasting for at least 8 hours.   BUN 05/21/2024 12  6 - 20 mg/dL Final   Creatinine, Ser 05/21/2024 0.92  0.61 - 1.24 mg/dL Final   Calcium 89/69/7974 9.1  8.9 - 10.3 mg/dL Final   Total Protein 89/69/7974 7.4  6.5 - 8.1 g/dL Final   Albumin 89/69/7974 3.9  3.5 - 5.0 g/dL Final   AST 89/69/7974 33  15 - 41 U/L Final   ALT 05/21/2024 48 (H)  0 - 44 U/L Final   Alkaline Phosphatase 05/21/2024 94  38 - 126 U/L Final   Total Bilirubin 05/21/2024 0.7  0.0 - 1.2 mg/dL Final   GFR, Estimated 05/21/2024 >60  >60 mL/min Final   Comment: (NOTE) Calculated using the CKD-EPI Creatinine Equation (2021)    Anion gap 05/21/2024 16 (H)  5 - 15 Final   Performed at [facility], [address] [place], [place] [address]   Hgb A1c MFr Bld 05/21/2024 6.1 (H)  4.8 - 5.6 % Final   Comment: (NOTE) Diagnosis of Diabetes The following HbA1c ranges recommended by the  American Diabetes Association (ADA) may be  used as an aid in the diagnosis of diabetes mellitus.  Hemoglobin             Suggested A1C NGSP%              Diagnosis  <5.7                   Non Diabetic  5.7-6.4                Pre-Diabetic  >6.4                   Diabetic  <7.0                   Glycemic control for                       adults with diabetes.     Mean Plasma Glucose 05/21/2024 128.37  mg/dL Final   Performed at [facility], [address] [place], [place] [address]   Alcohol, Ethyl (B) 05/21/2024 139 (H)  <15 mg/dL Final   Comment: (NOTE) For medical purposes only. Performed at [facility], [address] [place], [place] [address]    Cholesterol 05/21/2024 189  0 - 200 mg/dL Final   Triglycerides 89/69/7974 184 (H)  <150 mg/dL Final   HDL 89/69/7974 34 (L)  >40 mg/dL Final   Total CHOL/HDL Ratio 05/21/2024 5.6  RATIO Final   VLDL 05/21/2024 37  0 - 40 mg/dL Final   LDL Cholesterol 05/21/2024 118 (H)  0 - 99 mg/dL Final   Comment:        Total Cholesterol/HDL:CHD Risk Coronary Heart Disease Risk Table                     Men   Women  1/2 Average Risk   3.4   3.3  Average Risk       5.0   4.4  2 X Average Risk   9.6   7.1  3 X Average Risk  23.4   11.0        Use the calculated Patient Ratio above and the CHD Risk Table to determine the patient's CHD Risk.        ATP III CLASSIFICATION (LDL):  <100     mg/dL   Optimal  899-870  mg/dL   Near or Above                    Optimal  130-159  mg/dL   Borderline  839-810  mg/dL   High  >809     mg/dL   Very High Performed at [facility], [address] [place], [place] [address]    TSH 05/21/2024 1.704  0.350 - 4.500 uIU/mL Final   Comment: Performed by a 3rd Generation assay with a functional sensitivity of <=0.01 uIU/mL. Performed at [facility], [address] [place], [place] [address]    POC Amphetamine UR 05/21/2024 None Detected  NONE DETECTED (Cut Off Level 1000 ng/mL) Final   POC Secobarbital (BAR)  05/21/2024 None Detected  NONE DETECTED (Cut Off Level 300 ng/mL) Final   POC Buprenorphine (BUP) 05/21/2024 None Detected  NONE DETECTED (Cut Off Level 10 ng/mL) Final   POC Oxazepam (BZO) 05/21/2024 None Detected  NONE DETECTED (Cut Off Level 300 ng/mL) Final   POC Cocaine UR 05/21/2024 None Detected  NONE DETECTED (Cut Off Level 300 ng/mL) Final   POC Methamphetamine UR 05/21/2024 None Detected  NONE DETECTED (Cut Off Level 1000 ng/mL) Final   POC Morphine 05/21/2024 None Detected  NONE DETECTED (Cut Off Level 300 ng/mL) Final   POC Methadone UR 05/21/2024 None Detected  NONE DETECTED (Cut Off Level 300 ng/mL) Final   POC Oxycodone UR 05/21/2024 None Detected  NONE DETECTED (Cut Off Level 100 ng/mL) Final   POC Marijuana UR 05/21/2024 Positive (A)  NONE DETECTED (Cut Off Level 50 ng/mL) Final    Allergies: Patient has no known allergies.  Medications:  Facility Ordered Medications  Medication   acetaminophen (TYLENOL) tablet 650 mg   alum & mag hydroxide-simeth (MAALOX/MYLANTA) 200-200-20 MG/5ML suspension 30 mL   magnesium hydroxide (MILK OF MAGNESIA) suspension 30 mL   [COMPLETED] thiamine (VITAMIN B1) injection 100 mg   multivitamin with minerals tablet 1 tablet   chlordiazePOXIDE (LIBRIUM) capsule 25 mg   hydrOXYzine (ATARAX) tablet 25 mg   loperamide (IMODIUM) capsule 2-4 mg   ondansetron (ZOFRAN-ODT) disintegrating tablet 4 mg   haloperidol (HALDOL) tablet 5 mg   And   diphenhydrAMINE (BENADRYL) capsule 50 mg   haloperidol lactate (HALDOL) injection 5 mg   And   diphenhydrAMINE (BENADRYL) injection 50 mg   And   LORazepam (ATIVAN) injection 2 mg   haloperidol lactate (HALDOL) injection 10 mg   And   diphenhydrAMINE (BENADRYL) injection 50 mg   And   LORazepam (ATIVAN) injection 2 mg   traZODone (DESYREL) tablet 50 mg   chlordiazePOXIDE (LIBRIUM) capsule 25 mg   Followed by   [name] 05/23/2024] chlordiazePOXIDE (LIBRIUM) capsule 25 mg   Followed by   [name]  05/24/2024] chlordiazePOXIDE (LIBRIUM) capsule 25 mg   Followed by   [name] 05/25/2024] chlordiazePOXIDE (LIBRIUM) capsule 25 mg   losartan (COZAAR) tablet 50 mg   aspirin tablet 325 mg   metFORMIN (GLUCOPHAGE) tablet 500 mg    Long Term Goals: Improvement in symptoms so as ready for discharge  Short Term Goals: Patient will verbalize feelings in meetings with treatment team members., Patient will attend at least of 50% of the groups daily., Pt will complete the PHQ9 on admission, day 3 and discharge., Patient will participate in completing the [place] Suicide Severity Rating Scale, Patient will score a low risk of violence for 24 hours prior to discharge, and Patient will take medications as prescribed daily.  Medical Decision Making  Recommend admission to the [facility] for alcohol detox treatment and mood stabilization.  Recommend CIWA protocol medications-AUD Consult for registered dietitian and diabetes coordinator placed-HA1C 6.1. No prior history of treatment.   Lab Orders         CBC with Differential/Platelet         Comprehensive metabolic panel         Hemoglobin A1c         Ethanol         Lipid panel         TSH         POCT Urine Drug Screen - (I-Screen)     EKG  Home medications continued -Aspirin 325 mg p.o. daily for CVD risk prophylaxis -Cozaar 50 mg p.o. twice daily for HTN  New medication started -Metformin 500 mg PO BID with meals for type 2 diabetes-HA1c 6.1  Other as needed -Trazodone, MOM, Maalox, Tylenol -Agitation protocol medications  Addendum: This provider reached out to Dr Francesca at [facility] about pt's abnormal/elevated CBC and  neutrophil levels. Pt currently afebrile. Denies H/a, nausea, vomiting or other discomfort. Recommendations to monitor for changes.    Recommendations  Based on my evaluation the patient does not appear to have an emergency medical condition.  Recommend admission to the [facility] for alcohol detox treatment and  stabilization. Recommend CIWA protocol  Thurman LULLA Ivans, NP 05/22/24  12:21 AM
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
## 2024-05-22 ENCOUNTER — Encounter (HOSPITAL_COMMUNITY): Payer: Self-pay | Admitting: Nurse Practitioner

## 2024-05-22 MED ORDER — METFORMIN HCL 500 MG PO TABS
500.0000 mg | ORAL_TABLET | Freq: Two times a day (BID) | ORAL | Status: DC
  Filled 2024-05-22 (×2): qty 1

## 2024-05-22 NOTE — ED Notes (Signed)
 Patient asleep in the bedroom, NAD Environment secured per policy, Q15 Checks in place.

## 2024-05-22 NOTE — Care Management (Addendum)
 [facility] Care Management...  Writer met with the patient and discussed discharge planning.  Patient requests inpatient substance abuse treatment.     Eugene [name] 48 y/o male. Patient stated he has [place] address however, he is currently homeless and seperated. Patient reported MH/SA. Patient reported depression, anxiety and SI. Patient reported alcohol dependence drinking everyday.  Patient has been referred to [facility] and [facility].   3:00 pm   Patient under review at [facility] and [facility]  4:52 pm  Writer received call from [facility]  Patient does not meet criteria due to starting new medication. They require 4 weeks after starting new medication.    Writer will follow-up Monday

## 2024-05-22 NOTE — Group Note (Signed)
 Group Topic: Decisional Balance/Substance Abuse  Group Date: 05/22/2024 Start Time: 1230 End Time: 1300 Facilitators: Stanly Stabile, RN  Department: [facility]  Number of Participants: 6  Group Focus: chemical dependency education and chemical dependency issues Treatment Modality:  Solution-Focused Therapy Interventions utilized were patient education Purpose: express feelings, express irrational fears, improve communication skills, increase insight, regain self-worth, reinforce self-care, relapse prevention strategies, and trigger / craving management  Name: Jerryl Holzhauer Date of Birth: 27-Aug-1975  MR: [id number]    Level of Participation: minimal Quality of Participation: attentive Interactions with others: gave feedback Mood/Affect: appropriate Triggers (if applicable):   Cognition: coherent/clear and concrete Progress: Gaining insight Response:   Plan: follow-up needed  Patients Problems:  Patient Active Problem List   Diagnosis Date Noted   Alcohol use disorder, severe, dependence (HCC) 05/21/2024

## 2024-05-22 NOTE — Group Note (Signed)
 Group Topic: Positive Affirmations  Group Date: 05/22/2024 Start Time: 1930 End Time: 2000 Facilitators: Verdon Jacqualyn BRAVO, NT  Department: [facility]  Number of Participants: 6  Group Focus: daily focus Treatment Modality:  Individual Therapy Interventions utilized were group exercise Purpose: express feelings  Name: Nataniel Gasper Date of Birth: 03-16-1976  MR: [id number]    Level of Participation: active Quality of Participation: cooperative Interactions with others: gave feedback Mood/Affect: appropriate Triggers (if applicable): n/a Cognition: coherent/clear Progress: Moderate Response: n/a Plan: follow-up needed  Patients Problems:  Patient Active Problem List   Diagnosis Date Noted   Alcohol use disorder, severe, dependence (HCC) 05/21/2024

## 2024-05-22 NOTE — [facility] Group Notes (Signed)
 Spiritual Care and Counseling Group Note  05/22/2024 2:45pm  Facilitated by: Librada Donnice Lin   Type of Therapy and Topic:  Hope    Participation Level:  None  Description of Group:  Group focused on topic of hope.  Patients participated in facilitated discussion around topic, connecting with one another around experiences and definitions for hope.  Group members engaged with group word cloud.  Members selected an image of what hope looks like for them today.  Group engaged in discussion around how their definitions of hope are present today in hospital.      Summary of Patient Progress:  Eugene [name] was present throughout group.  He slept through majority of group.  Did not engage in group discussion    Therapeutic Modalities: Psycho-social ed, Adlerian, Narrative, MI   Librada Donnice Lin, Chaplain 05/22/2024 4:50 PM
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
## 2024-05-22 NOTE — Group Note (Signed)
 Group Topic: Healthy Self Image and Positive Change  Group Date: 05/22/2024 Start Time: 1430 End Time: 1500 Facilitators: Lonzell Dwayne RAMAN, NT  Department: [facility]  Number of Participants: 3  Group Focus: acceptance Treatment Modality:  Cognitive Behavioral Therapy Interventions utilized were patient education Purpose: explore maladaptive thinking  Name: Eugene [name] Date of Birth: 1976-02-26  MR: [id number]    Level of Participation: Patient did attend group Quality of Participation: attentive Interactions with others: gave feedback Mood/Affect: positive Triggers (if applicable): N/A Cognition: coherent/clear Progress: Moderate Response: Appropriate  Plan: follow-up needed  Patients Problems:  Patient Active Problem List   Diagnosis Date Noted   Alcohol use disorder, severe, dependence (HCC) 05/21/2024

## 2024-05-22 NOTE — ED Notes (Signed)
 Pt endorses passive SI, no plan, verbally contracts for safety. Denies HI/AVH. Gatorade provided. CIWA in place, HR elevated, provider notified. Pt given prn atarax, recheck VS at 0145. Pt calm, cooperative. Denies pain. Skin check unremarkable.

## 2024-05-22 NOTE — ED Notes (Signed)
 Pt is sleeping, no acute distress noted. Q15 min safety checks continued.

## 2024-05-23 DIAGNOSIS — F322 Major depressive disorder, single episode, severe without psychotic features: Secondary | ICD-10-CM | POA: Diagnosis not present

## 2024-05-23 DIAGNOSIS — R45851 Suicidal ideations: Secondary | ICD-10-CM | POA: Diagnosis not present

## 2024-05-23 DIAGNOSIS — F1020 Alcohol dependence, uncomplicated: Secondary | ICD-10-CM | POA: Diagnosis not present

## 2024-05-23 DIAGNOSIS — F1290 Cannabis use, unspecified, uncomplicated: Secondary | ICD-10-CM | POA: Diagnosis not present

## 2024-05-23 LAB — CBC
HCT: 51.8 % (ref 39.0–52.0)
Hemoglobin: 17.1 g/dL — ABNORMAL HIGH (ref 13.0–17.0)
MCH: 29.8 pg (ref 26.0–34.0)
MCHC: 33 g/dL (ref 30.0–36.0)
MCV: 90.2 fL (ref 80.0–100.0)
Platelets: 378 K/uL (ref 150–400)
RBC: 5.74 MIL/uL (ref 4.22–5.81)
RDW: 14 % (ref 11.5–15.5)
WBC: 13.1 K/uL — ABNORMAL HIGH (ref 4.0–10.5)
nRBC: 0 % (ref 0.0–0.2)

## 2024-05-23 MED ORDER — SERTRALINE HCL 25 MG PO TABS
25.0000 mg | ORAL_TABLET | Freq: Every day | ORAL | Status: DC
  Administered 2024-05-23 – 2024-05-24 (×2): 25 mg via ORAL
  Filled 2024-05-23 (×2): qty 1

## 2024-05-23 NOTE — ED Notes (Signed)
 Patient resting with eyes closed in no apparent acute distress. Respirations even and unlabored. Environment secured. Safety checks in place according to facility policy.

## 2024-05-23 NOTE — Group Note (Signed)
 Group Topic: Overcoming Obstacles  Group Date: 05/23/2024 Start Time: 0100 End Time: 0125 Facilitators: Lonzell Dwayne RAMAN, NT  Department: [facility]  Number of Participants: 1  Group Focus: self-awareness Treatment Modality:  Individual Therapy Interventions utilized were patient education Purpose: enhance coping skills  Name: Eugene [name] Date of Birth: July 19, 1976  MR: [id number]    Level of Participation: Patient did not attend group Quality of Participation: N/A Interactions with others: N/A Mood/Affect: N/A Triggers (if applicable): N/A Cognition: N/A Progress: N/A Response: N/A Plan: N/A  Patients Problems:  Patient Active Problem List   Diagnosis Date Noted   Alcohol use disorder, severe, dependence (HCC) 05/21/2024

## 2024-05-23 NOTE — ED Notes (Signed)
 Patient alert & oriented x4. Denies HI, endorses passive SI at this time but denies any intent or plan to harm self when asked. Denies A/VH. Patient reports pain in back rating 2/10, denies need for pain medication at this time stating it is likely from laying here so long. No acute distress noted. Scheduled medications administered with no complications with exception of metformin which patient refused. Patient states metformin makes him sick and that he is not diabetic. Support and encouragement provided. Patient observed in milieu. No inappropriate behaviors observed or reported. Routine safety checks conducted per facility protocol. Encouraged patient to notify staff if any thoughts of harm towards self or others arise. Patient verbalizes understanding and agreement.

## 2024-05-23 NOTE — ED Notes (Signed)
 Pt asleep, CIWA not assessed at this time.

## 2024-05-23 NOTE — Group Note (Signed)
 Group Topic: Social Support  Group Date: 05/23/2024 Start Time: 1200 End Time: 1230 Facilitators: Antha Niday, Zane HERO, RN  Department: [facility]  Number of Participants: 1  Group Focus: check in and feeling awareness/expression Treatment Modality:  Individual Therapy Interventions utilized were support Purpose: express feelings and increase insight  Name: Eugene [name] Date of Birth: 1976/05/22  MR: [id number]    Level of Participation: moderate Quality of Participation: attentive and cooperative Interactions with others: gave feedback Mood/Affect: appropriate Triggers (if applicable): None identified at this time Cognition: coherent/clear and logical Progress: Gaining insight Response: Patient voiced no concerns at this time. Support and encouragement provided.  Plan: patient will be encouraged to continue to attend groups and programming on the unit  Patients Problems:  Patient Active Problem List   Diagnosis Date Noted   Alcohol use disorder, severe, dependence (HCC) 05/21/2024

## 2024-05-23 NOTE — Progress Notes (Signed)
 Patient is alert and oriented x 4 with no acute distress noted.  Patient appears flat with slightly irritable mood, and observed in his room reading a book during shift.  Patient denies SI/HI/AVH, but stated I never want to hurt anyone that's why I drink myself to death.  Patient was compliant with his scheduled medications during shift.  No other behaviors noted or concerns voiced at this time.  Will continue to monitor on safety checks Q 15 min and monitor for safety/behavior.

## 2024-05-23 NOTE — ED Provider Notes (Signed)
 Behavioral Health Progress Note  Date and Time: 05/23/2024 1:43 PM Name: Eugene [name] MRN:  [id number]  Subjective:  Eugene [name] is a 48 year old male patient with a psychiatric history significant for alcohol use disorder who [facility], [facility] for complaints of suicidal thoughts with a plan to walk into traffic and worsening depression. Patient reported that his wife took out a restraining order on him and had him removed from the house. He reported drinking a 12 pack of beer daily mixed with liquor and using a half to a gram of marijuana daily. UDS positive for THC and BAL 139 on arrival.   On evaluation, patient endorses passive suicidal thoughts of death with no plan or intention. He verbally contracts for safety here on the unit. He reports 1 suicide attempt a long time ago in his 21s by taking pills. He denies homicidal thoughts. He endorses depressive symptoms of sadness, hopelessness, worthlessness, crying spells, irritability, decreased energy, decreased motivation, poor appetite, and difficulty sleeping. PHQ-9 score 27 on arrival. He denies taking medications for depression. He states that in the past when he was a kid he was prescribed Depakote because he was having to deal with his parents problems who were alcoholics. He states that his parents are decreased. He denies auditory or visual hallucinations. No paranoia. Objectively, no signs of acute psychosis. He denies physical pain. He denies medication side effects. He reports alcohol withdrawal symptoms of tremors, anxiety and states that he had diarrhea yesterday. CIWA score 4 this afternoon. He denies a support system. He is interested in residential substance abuse rehabilitation. He denies past substance abuse rehabilitation treatment programs.   Diagnosis:  Final diagnoses:  Alcohol use disorder, severe, dependence (HCC)  Suicidal ideations  Marijuana user  Depression, major,  single episode, severe (HCC)    Total Time spent with patient: 30 minutes  Past Psychiatric History: History of alcohol use disorder.  Past Medical History: History of sleep apnea (no cpap) and hypertension.  Family Psychiatric  History: He reports that both of his deceased parents were alcoholics  Social History: He states that he was married for 5 years and together for almost 18 years and together they share 7 children that are grown. He has 3 biological children. He is unemployed. He reports alcohol and marijuana use.   Current Medications:  Current Facility-Administered Medications  Medication Dose Route Frequency Provider Last Rate Last Admin   acetaminophen (TYLENOL) tablet 650 mg  650 mg Oral Q6H PRN Onuoha, Chinwendu V, NP       alum & mag hydroxide-simeth (MAALOX/MYLANTA) 200-200-20 MG/5ML suspension 30 mL  30 mL Oral Q4H PRN Onuoha, Chinwendu V, NP       aspirin tablet 325 mg  325 mg Oral Daily Onuoha, Chinwendu V, NP   325 mg at 05/23/24 0930   chlordiazePOXIDE (LIBRIUM) capsule 25 mg  25 mg Oral Q6H PRN Onuoha, Chinwendu V, NP       chlordiazePOXIDE (LIBRIUM) capsule 25 mg  25 mg Oral TID Onuoha, Chinwendu V, NP       Followed by   [name] 05/24/2024] chlordiazePOXIDE (LIBRIUM) capsule 25 mg  25 mg Oral BH-qamhs Onuoha, Chinwendu V, NP       Followed by   [name] 05/25/2024] chlordiazePOXIDE (LIBRIUM) capsule 25 mg  25 mg Oral Daily Onuoha, Chinwendu V, NP       haloperidol (HALDOL) tablet 5 mg  5 mg Oral TID PRN Onuoha, Chinwendu V,  NP       And   diphenhydrAMINE (BENADRYL) capsule 50 mg  50 mg Oral TID PRN Onuoha, Chinwendu V, NP       haloperidol lactate (HALDOL) injection 5 mg  5 mg Intramuscular TID PRN Onuoha, Chinwendu V, NP       And   diphenhydrAMINE (BENADRYL) injection 50 mg  50 mg Intramuscular TID PRN Onuoha, Chinwendu V, NP       And   LORazepam (ATIVAN) injection 2 mg  2 mg Intramuscular TID PRN Onuoha, Chinwendu V, NP       haloperidol lactate (HALDOL)  injection 10 mg  10 mg Intramuscular TID PRN Onuoha, Chinwendu V, NP       And   diphenhydrAMINE (BENADRYL) injection 50 mg  50 mg Intramuscular TID PRN Onuoha, Chinwendu V, NP       And   LORazepam (ATIVAN) injection 2 mg  2 mg Intramuscular TID PRN Onuoha, Chinwendu V, NP       hydrOXYzine (ATARAX) tablet 25 mg  25 mg Oral Q6H PRN Onuoha, Chinwendu V, NP   25 mg at 05/22/24 0117   loperamide (IMODIUM) capsule 2-4 mg  2-4 mg Oral PRN Onuoha, Chinwendu V, NP       losartan (COZAAR) tablet 50 mg  50 mg Oral BID Onuoha, Chinwendu V, NP   50 mg at 05/23/24 0818   magnesium hydroxide (MILK OF MAGNESIA) suspension 30 mL  30 mL Oral Daily PRN Onuoha, Chinwendu V, NP       metFORMIN (GLUCOPHAGE) tablet 500 mg  500 mg Oral BID WC Onuoha, Chinwendu V, NP       multivitamin with minerals tablet 1 tablet  1 tablet Oral Daily Onuoha, Chinwendu V, NP   1 tablet at 05/23/24 0930   ondansetron (ZOFRAN-ODT) disintegrating tablet 4 mg  4 mg Oral Q6H PRN Onuoha, Chinwendu V, NP       sertraline (ZOLOFT) tablet 25 mg  25 mg Oral Daily Everlina Gotts L, NP       traZODone (DESYREL) tablet 50 mg  50 mg Oral QHS PRN Onuoha, Chinwendu V, NP   50 mg at 05/22/24 2109   Current Outpatient Medications  Medication Sig Dispense Refill   aspirin EC 325 MG tablet Take 325 mg by mouth daily.     losartan (COZAAR) 50 MG tablet Take 100 mg by mouth daily.      Labs  Lab Results:  Admission on 05/21/2024  Component Date Value Ref Range Status   WBC 05/21/2024 17.8 (H)  4.0 - 10.5 K/uL Final   RBC 05/21/2024 6.35 (H)  4.22 - 5.81 MIL/uL Final   Hemoglobin 05/21/2024 18.7 (H)  13.0 - 17.0 g/dL Final   HCT 89/69/7974 56.3 (H)  39.0 - 52.0 % Final   MCV 05/21/2024 88.7  80.0 - 100.0 fL Final   MCH 05/21/2024 29.4  26.0 - 34.0 pg Final   MCHC 05/21/2024 33.2  30.0 - 36.0 g/dL Final   RDW 89/69/7974 13.8  11.5 - 15.5 % Final   Platelets 05/21/2024 388  150 - 400 K/uL Final   nRBC 05/21/2024 0.0  0.0 - 0.2 % Final    Neutrophils Relative % 05/21/2024 72  % Final   Neutro Abs 05/21/2024 12.9 (H)  1.7 - 7.7 K/uL Final   Lymphocytes Relative 05/21/2024 20  % Final   Lymphs Abs 05/21/2024 3.5  0.7 - 4.0 K/uL Final   Monocytes Relative 05/21/2024 5  % Final  Monocytes Absolute 05/21/2024 0.9  0.1 - 1.0 K/uL Final   Eosinophils Relative 05/21/2024 1  % Final   Eosinophils Absolute 05/21/2024 0.1  0.0 - 0.5 K/uL Final   Basophils Relative 05/21/2024 1  % Final   Basophils Absolute 05/21/2024 0.2 (H)  0.0 - 0.1 K/uL Final   Immature Granulocytes 05/21/2024 1  % Final   Abs Immature Granulocytes 05/21/2024 0.23 (H)  0.00 - 0.07 K/uL Final   Performed at [facility], [address] [place], [place] [address]   Sodium 05/21/2024 132 (L)  135 - 145 mmol/L Final   Potassium 05/21/2024 4.2  3.5 - 5.1 mmol/L Final   Chloride 05/21/2024 96 (L)  98 - 111 mmol/L Final   CO2 05/21/2024 20 (L)  22 - 32 mmol/L Final   Glucose, Bld 05/21/2024 85  70 - 99 mg/dL Final   Glucose reference range applies only to samples taken after fasting for at least 8 hours.   BUN 05/21/2024 12  6 - 20 mg/dL Final   Creatinine, Ser 05/21/2024 0.92  0.61 - 1.24 mg/dL Final   Calcium 89/69/7974 9.1  8.9 - 10.3 mg/dL Final   Total Protein 89/69/7974 7.4  6.5 - 8.1 g/dL Final   Albumin 89/69/7974 3.9  3.5 - 5.0 g/dL Final   AST 89/69/7974 33  15 - 41 U/L Final   ALT 05/21/2024 48 (H)  0 - 44 U/L Final   Alkaline Phosphatase 05/21/2024 94  38 - 126 U/L Final   Total Bilirubin 05/21/2024 0.7  0.0 - 1.2 mg/dL Final   GFR, Estimated 05/21/2024 >60  >60 mL/min Final   Comment: (NOTE) Calculated using the CKD-EPI Creatinine Equation (2021)    Anion gap 05/21/2024 16 (H)  5 - 15 Final   Performed at [facility], [address] [place], [place] [address]   Hgb A1c MFr Bld 05/21/2024 6.1 (H)  4.8 - 5.6 % Final   Comment: (NOTE) Diagnosis of Diabetes The following HbA1c ranges recommended by the American Diabetes Association  (ADA) may be used as an aid in the diagnosis of diabetes mellitus.  Hemoglobin             Suggested A1C NGSP%              Diagnosis  <5.7                   Non Diabetic  5.7-6.4                Pre-Diabetic  >6.4                   Diabetic  <7.0                   Glycemic control for                       adults with diabetes.     Mean Plasma Glucose 05/21/2024 128.37  mg/dL Final   Performed at [facility], [address] [place], [place] [address]   Alcohol, Ethyl (B) 05/21/2024 139 (H)  <15 mg/dL Final   Comment: (NOTE) For medical purposes only. Performed at [facility], [address] [place], [place] [address]    Cholesterol 05/21/2024 189  0 - 200 mg/dL Final   Triglycerides 89/69/7974 184 (H)  <150 mg/dL Final   HDL 89/69/7974 34 (L)  >40 mg/dL Final  Total CHOL/HDL Ratio 05/21/2024 5.6  RATIO Final   VLDL 05/21/2024 37  0 - 40 mg/dL Final   LDL Cholesterol 05/21/2024 118 (H)  0 - 99 mg/dL Final   Comment:        Total Cholesterol/HDL:CHD Risk Coronary Heart Disease Risk Table                     Men   Women  1/2 Average Risk   3.4   3.3  Average Risk       5.0   4.4  2 X Average Risk   9.6   7.1  3 X Average Risk  23.4   11.0        Use the calculated Patient Ratio above and the CHD Risk Table to determine the patient's CHD Risk.        ATP III CLASSIFICATION (LDL):  <100     mg/dL   Optimal  899-870  mg/dL   Near or Above                    Optimal  130-159  mg/dL   Borderline  839-810  mg/dL   High  >809     mg/dL   Very High Performed at [facility], [address], [place], [place] [address]    TSH 05/21/2024 1.704  0.350 - 4.500 uIU/mL Final   Comment: Performed by a 3rd Generation assay with a functional sensitivity of <=0.01 uIU/mL. Performed at [facility], [address], [place], [place] [address]    POC Amphetamine UR 05/21/2024 None Detected  NONE DETECTED (Cut Off Level 1000 ng/mL) Final   POC Secobarbital  (BAR) 05/21/2024 None Detected  NONE DETECTED (Cut Off Level 300 ng/mL) Final   POC Buprenorphine (BUP) 05/21/2024 None Detected  NONE DETECTED (Cut Off Level 10 ng/mL) Final   POC Oxazepam (BZO) 05/21/2024 None Detected  NONE DETECTED (Cut Off Level 300 ng/mL) Final   POC Cocaine UR 05/21/2024 None Detected  NONE DETECTED (Cut Off Level 300 ng/mL) Final   POC Methamphetamine UR 05/21/2024 None Detected  NONE DETECTED (Cut Off Level 1000 ng/mL) Final   POC Morphine 05/21/2024 None Detected  NONE DETECTED (Cut Off Level 300 ng/mL) Final   POC Methadone UR 05/21/2024 None Detected  NONE DETECTED (Cut Off Level 300 ng/mL) Final   POC Oxycodone UR 05/21/2024 None Detected  NONE DETECTED (Cut Off Level 100 ng/mL) Final   POC Marijuana UR 05/21/2024 Positive (A)  NONE DETECTED (Cut Off Level 50 ng/mL) Final    Blood Alcohol level:  Lab Results  Component Value Date   ETH 139 (H) 05/21/2024    Metabolic Disorder Labs: Lab Results  Component Value Date   HGBA1C 6.1 (H) 05/21/2024   MPG 128.37 05/21/2024   No results found for: PROLACTIN Lab Results  Component Value Date   CHOL 189 05/21/2024   TRIG 184 (H) 05/21/2024   HDL 34 (L) 05/21/2024   CHOLHDL 5.6 05/21/2024   VLDL 37 05/21/2024   LDLCALC 118 (H) 05/21/2024    Therapeutic Lab Levels: No results found for: LITHIUM No results found for: VALPROATE No results found for: CBMZ  Physical Findings   PHQ2-9    Flowsheet Row ED from 05/21/2024 in [facility]  PHQ-2 Total Score 6  PHQ-9 Total Score 27   Flowsheet Row ED from 05/21/2024 in [facility] Most recent reading at 05/22/2024 12:52  AM ED from 05/21/2024 in [facility] Most recent reading at 05/21/2024  8:51 PM  C-SSRS RISK CATEGORY No Risk No Risk     Musculoskeletal  Strength & Muscle Tone: within normal limits Gait & Station: normal Patient leans: N/A  Psychiatric  Specialty Exam  Presentation  General Appearance:  Appropriate for Environment  Eye Contact: Fair  Speech: Clear and Coherent  Speech Volume: Normal  Handedness: Right   Mood and Affect  Mood: Depressed  Affect: Congruent   Thought Process  Thought Processes: Coherent  Descriptions of Associations:Intact  Orientation:Full (Time, Place and Person)  Thought Content:Logical  Diagnosis of Schizophrenia or Schizoaffective disorder in past: No    Hallucinations:Hallucinations: None  Ideas of Reference:None  Suicidal Thoughts:Suicidal Thoughts: Yes, Passive SI Active Intent and/or Plan: Without Intent; Without Plan  Homicidal Thoughts:Homicidal Thoughts: No   Sensorium  Memory: Immediate Fair; Recent Fair; Remote Fair  Judgment: Intact  Insight: Present   Executive Functions  Concentration: Fair  Attention Span: Fair  Recall: [employer] of Knowledge: Fair  Language: Fair   Psychomotor Activity  Psychomotor Activity: Psychomotor Activity: Normal   Assets  Assets: Communication Skills; Desire for Improvement   Sleep  Sleep: Sleep: Poor    Physical Exam  Physical Exam HENT:     Nose: Nose normal.  Pulmonary:     Effort: Pulmonary effort is normal.  Musculoskeletal:        General: Normal range of motion.     Cervical back: Normal range of motion.  Neurological:     Mental Status: He is alert and oriented to person, place, and time.    Review of Systems  Constitutional: Negative.   HENT: Negative.    Eyes: Negative.   Respiratory: Negative.    Cardiovascular: Negative.   Gastrointestinal: Negative.   Genitourinary: Negative.   Musculoskeletal: Negative.   Neurological: Negative.   Psychiatric/Behavioral:  Positive for depression, substance abuse and suicidal ideas. The patient has insomnia.    Blood pressure 113/79, pulse 87, temperature 97.9 F (36.6 C), temperature source Oral, resp. rate 18, SpO2 98%. There is  no height or weight on file to calculate BMI.  Treatment Plan Summary: Ha Placeres is a 48 year old male patient with a psychiatric history significant for alcohol use disorder who [facility], [facility]24 for complaints of suicidal thoughts with a plan to walk into traffic and worsening depression. Patient reported that his wife took out a restraining order on him and had him removed from the house. He reported drinking a 12 pack of beer daily mixed with liquor and using a half to a gram of marijuana daily.   Labs Lipase pending to rule out pancreatitis  Repeat CBC due to elevated WBC 17.8, no apparent signs on infection, patient is afebrile, no cough, localize pain, chest pain, or SOB UDS positive for THC and BAL 139 on arrival.  Medication regimen -Continue Librium taper, ends 11-3 for AUD -I discussed the risks and benefits of initiating Zoloft 25 mg daily for depression, patient advised of potential risk for ED in taking antidepressants. -Continue aspirin 325 mg po daily hx of cardiac disease  -Continue Cozaar 50 mg po daily for HTN -Continue metformin 500 mg po BID for DM  Disposition-patient interested in residential rehabilitation. Patient denied by [facility]. Patient under review for [facility].  Anticipated discharge date scheduled for May 27, 2024.  Teresa Wyline CROME, NP 05/23/2024 1:43 PM

## 2024-05-23 NOTE — ED Notes (Signed)
 Patient asleep, NAD. will continue to monitor for safety.

## 2024-05-23 NOTE — ED Notes (Signed)
 Patient sitting in bedroom, calm and composed. No acute distress noted. No concerns voiced. No inappropriate behaviors observed or reported at this time. Informed patient to notify staff with any needs or assistance. Patient verbalized understanding or agreement. Safety checks in place per facility policy.

## 2024-05-24 ENCOUNTER — Inpatient Hospital Stay (HOSPITAL_COMMUNITY)
Admission: AD | Admit: 2024-05-24 | Discharge: 2024-05-28 | DRG: 885 | Disposition: A | Discharge status: Home / Self Care | Source: Intra-hospital | Attending: Student in an Organized Health Care Education/Training Program | Admitting: Student in an Organized Health Care Education/Training Program

## 2024-05-24 ENCOUNTER — Other Ambulatory Visit: Payer: Self-pay

## 2024-05-24 ENCOUNTER — Encounter (HOSPITAL_COMMUNITY): Payer: Self-pay

## 2024-05-24 DIAGNOSIS — F1290 Cannabis use, unspecified, uncomplicated: Secondary | ICD-10-CM | POA: Diagnosis not present

## 2024-05-24 DIAGNOSIS — Y906 Blood alcohol level of 120-199 mg/100 ml: Secondary | ICD-10-CM | POA: Diagnosis present

## 2024-05-24 DIAGNOSIS — E669 Obesity, unspecified: Secondary | ICD-10-CM | POA: Diagnosis present

## 2024-05-24 DIAGNOSIS — Z5901 Sheltered homelessness: Secondary | ICD-10-CM | POA: Diagnosis not present

## 2024-05-24 DIAGNOSIS — I10 Essential (primary) hypertension: Secondary | ICD-10-CM | POA: Diagnosis present

## 2024-05-24 DIAGNOSIS — Z59868 Other specified financial insecurity: Secondary | ICD-10-CM

## 2024-05-24 DIAGNOSIS — E119 Type 2 diabetes mellitus without complications: Secondary | ICD-10-CM | POA: Diagnosis present

## 2024-05-24 DIAGNOSIS — Z7984 Long term (current) use of oral hypoglycemic drugs: Secondary | ICD-10-CM | POA: Diagnosis not present

## 2024-05-24 DIAGNOSIS — F17210 Nicotine dependence, cigarettes, uncomplicated: Secondary | ICD-10-CM | POA: Diagnosis present

## 2024-05-24 DIAGNOSIS — F419 Anxiety disorder, unspecified: Secondary | ICD-10-CM | POA: Diagnosis present

## 2024-05-24 DIAGNOSIS — Z79899 Other long term (current) drug therapy: Secondary | ICD-10-CM | POA: Diagnosis not present

## 2024-05-24 DIAGNOSIS — F39 Unspecified mood [affective] disorder: Secondary | ICD-10-CM | POA: Diagnosis present

## 2024-05-24 DIAGNOSIS — Z560 Unemployment, unspecified: Secondary | ICD-10-CM | POA: Diagnosis not present

## 2024-05-24 DIAGNOSIS — Z5982 Transportation insecurity: Secondary | ICD-10-CM | POA: Diagnosis not present

## 2024-05-24 DIAGNOSIS — Z5948 Other specified lack of adequate food: Secondary | ICD-10-CM

## 2024-05-24 DIAGNOSIS — Z8249 Family history of ischemic heart disease and other diseases of the circulatory system: Secondary | ICD-10-CM | POA: Diagnosis not present

## 2024-05-24 DIAGNOSIS — Z7982 Long term (current) use of aspirin: Secondary | ICD-10-CM | POA: Diagnosis not present

## 2024-05-24 DIAGNOSIS — G4730 Sleep apnea, unspecified: Secondary | ICD-10-CM | POA: Diagnosis present

## 2024-05-24 DIAGNOSIS — F1020 Alcohol dependence, uncomplicated: Secondary | ICD-10-CM | POA: Diagnosis present

## 2024-05-24 DIAGNOSIS — Z5941 Food insecurity: Secondary | ICD-10-CM | POA: Diagnosis not present

## 2024-05-24 DIAGNOSIS — Z818 Family history of other mental and behavioral disorders: Secondary | ICD-10-CM | POA: Diagnosis not present

## 2024-05-24 DIAGNOSIS — Z6841 Body Mass Index (BMI) 40.0 and over, adult: Secondary | ICD-10-CM

## 2024-05-24 DIAGNOSIS — R45851 Suicidal ideations: Secondary | ICD-10-CM | POA: Diagnosis present

## 2024-05-24 DIAGNOSIS — F322 Major depressive disorder, single episode, severe without psychotic features: Secondary | ICD-10-CM

## 2024-05-24 DIAGNOSIS — F332 Major depressive disorder, recurrent severe without psychotic features: Principal | ICD-10-CM | POA: Diagnosis present

## 2024-05-24 MED ORDER — ALUM & MAG HYDROXIDE-SIMETH 200-200-20 MG/5ML PO SUSP: 30.0000 mL | ORAL | Status: DC | PRN

## 2024-05-24 MED ORDER — ASPIRIN 325 MG PO TABS
325.0000 mg | ORAL_TABLET | Freq: Every day | ORAL | Status: DC
  Administered 2024-05-25 – 2024-05-27 (×3): 325 mg via ORAL
  Filled 2024-05-24 (×3): qty 1

## 2024-05-24 MED ORDER — METFORMIN HCL 500 MG PO TABS: 500.0000 mg | ORAL_TABLET | Freq: Two times a day (BID) | ORAL | Status: DC

## 2024-05-24 MED ORDER — CHLORDIAZEPOXIDE HCL 25 MG PO CAPS: 25.0000 mg | ORAL_CAPSULE | Freq: Every day | ORAL | Status: DC

## 2024-05-24 MED ORDER — CHLORDIAZEPOXIDE HCL 25 MG PO CAPS
25.0000 mg | ORAL_CAPSULE | ORAL | Status: DC
  Filled 2024-05-24: qty 1

## 2024-05-24 MED ORDER — SERTRALINE HCL 25 MG PO TABS
25.0000 mg | ORAL_TABLET | Freq: Every day | ORAL | Status: DC
  Filled 2024-05-24: qty 1

## 2024-05-24 MED ORDER — ACETAMINOPHEN 325 MG PO TABS: 650.0000 mg | ORAL_TABLET | Freq: Four times a day (QID) | ORAL | Status: DC | PRN

## 2024-05-24 MED ORDER — SERTRALINE HCL 25 MG PO TABS: 25.0000 mg | ORAL_TABLET | Freq: Every day | ORAL | Status: DC

## 2024-05-24 MED ORDER — ADULT MULTIVITAMIN W/MINERALS CH
1.0000 | ORAL_TABLET | Freq: Every day | ORAL | Status: DC
  Administered 2024-05-25 – 2024-05-27 (×3): 1 via ORAL
  Filled 2024-05-24 (×3): qty 1
  Filled 2024-05-24: qty 7

## 2024-05-24 MED ORDER — LOSARTAN POTASSIUM 50 MG PO TABS
50.0000 mg | ORAL_TABLET | Freq: Two times a day (BID) | ORAL | Status: DC
  Administered 2024-05-25 – 2024-05-27 (×6): 50 mg via ORAL
  Filled 2024-05-24 (×5): qty 1
  Filled 2024-05-24: qty 7
  Filled 2024-05-24: qty 1

## 2024-05-24 MED ORDER — MAGNESIUM HYDROXIDE 400 MG/5ML PO SUSP: 30.0000 mL | Freq: Every day | ORAL | Status: DC | PRN

## 2024-05-24 NOTE — ED Provider Notes (Signed)
 Received a phone call from Quintin Ahle reportedly patient's stepson. Caller did have patient's name and stated he was pt's stepson but caller did not know pt's birth date. He said December I believe. Informed caller that writer is unable to give out information to caller but asked caller if he had information to share. Caller said I don't know what went on. I have been out of the [place]. I do know that he is not a harm to himself or others. He has been my step dad for 17 years. He has an alcohol problem. My mom has moved on from that. Caller said that caller got several messages from pt. Writer is receiving conflicting stories including that pt does not have stepson's telephone number. Caller did not confirm that patient could stay with caller.  IVC was written and upheld based on information pt stated to police when he first arrived. The caller was not giving a story that contradicted parts of the patient's story namely that pt cannot stay with caller. Requesting a higher level of care for this patient who came in with suicidal ideation and a plan to walk into traffic.

## 2024-05-24 NOTE — ED Notes (Signed)
 Patient approached nurses station asking about discharge timing. Patient informed him that the plan is for him to be leaving our facility later tonight. Patient apologized for earlier upsets. Environment secured, safety checks in place per facility policy.

## 2024-05-24 NOTE — Discharge Instructions (Addendum)
 Accpt to [facility]
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
## 2024-05-24 NOTE — Progress Notes (Signed)
 Meal given

## 2024-05-24 NOTE — Progress Notes (Signed)
 BHH/BMU LCSW Progress Note   05/24/2024    5:39 PM  Eugene [name]   [id number]   Type of Contact and Topic:  Psychiatric Bed Placement   Pt accepted to [facility] 306-1    Patient meets inpatient criteria per Garvin Gaines, MD     The attending provider will be Dr. Prentis  Call report to [phone]    [facility] Ward, RN @ [facility] notified.     Pt scheduled  to arrive at [place] TODAY.    Briant Angelillo, MSW, LCSW-A  5:40 PM 05/24/2024

## 2024-05-24 NOTE — Group Note (Signed)
 Group Topic: Healthy Self Image and Positive Change  Group Date: 05/24/2024 Start Time: 1200 End Time: 1230 Facilitators: Elnor Keven SAILOR  Department: [facility]  Number of Participants: 9  Group Focus: affirmation Treatment Modality:  Psychoeducation Interventions utilized were support Purpose: reinforce self-care  Name: Eugene [name] Date of Birth: 04-28-1976  MR: [id number]    Level of Participation: Pt left group in anger.  Quality of Participation: disruptive Interactions with others: Negative Mood/Affect: agitated Triggers (if applicable): NA Cognition: coherent/clear Progress: Minimal Response: Pt left group in frustration in response to other sharing about energy work, vibrations and merchant navy officer. Pt stated this is not help and walked out of group verbally angry.  Plan: follow-up needed  Patients Problems:  Patient Active Problem List   Diagnosis Date Noted   Alcohol use disorder, severe, dependence (HCC) 05/21/2024

## 2024-05-24 NOTE — Tx Team (Signed)
 Initial Treatment Plan 05/24/2024 10:33 PM [name] FMW:[id number]    PATIENT STRESSORS: Marital or family conflict   Substance abuse     PATIENT STRENGTHS: Active sense of humor  Average or above average intelligence  Capable of independent living  Communication skills  Supportive family/friends    PATIENT IDENTIFIED PROBLEMS: Substance use  Medication and treatment noncompliance                   DISCHARGE CRITERIA:  Ability to meet basic life and health needs Adequate post-discharge living arrangements Improved stabilization in mood, thinking, and/or behavior Motivation to continue treatment in a less acute level of care Reduction of life-threatening or endangering symptoms to within safe limits  PRELIMINARY DISCHARGE PLAN: Outpatient therapy Placement in alternative living arrangements  PATIENT/FAMILY INVOLVEMENT: This treatment plan has been presented to and reviewed with the patient, Eugene [name].  The patient has been given the opportunity to ask questions and make suggestions.  Claudius CHRISTELLA Pucker, RN 05/24/2024, 10:33 PM

## 2024-05-24 NOTE — Progress Notes (Signed)
 Patient is currently sitting on the side of the bed.  No acute distress noted.  Patient was asked if he needed anything, and he responded No.  No current issues noted at this time.  Will continue to monitor with safety checks Q 15 min for safety/behavior.

## 2024-05-24 NOTE — ED Notes (Signed)
 Patient alert & oriented x4. Denies intent to harm self or others when asked. Denies A/VH. Patient denies any physical complaints when asked. No acute distress noted. Support and encouragement provided. Patient observed in milieu. Minor irritability regarding television guidelines, patient isolated to room and was able to self regulate. Patient became irritable once again during group therapy stating how do you think this is going to help me? I know have a drinking problem but none of this is going to help. Routine safety checks conducted per facility protocol. Encouraged patient to notify staff if any thoughts of harm towards self or others arise. Patient verbalizes understanding and agreement.

## 2024-05-24 NOTE — Progress Notes (Signed)
 Patient has been denied by [facility] due to no appropriate beds available. Patient meets [facility] inpatient criteria per Rhoda Gottfried,MD. Patient has been faxed out to the following facilties:   [facility]  [address] [place] [address] [phone]  [facility]  [address] [place] [address] [phone]  [facility]  [address] [place], [place] [address] [phone]  [facility] Patient Placement  [facility], [place] [phone]  [facility]  [address] [place] [address] [phone]  [facility]  [address] [place] [address] [phone]  [facility] EFAX  [address] [place], [place] [phone]  [facility]  [address] [place] [address] [phone]  [facility]  [address] [place], [place] [address] [phone]  [facility]  [address], [place] [address] [phone]  [facility]  [address] [place] [address] [phone]  [facility]  [address] [place] [address] [phone]  [facility]  [address] [place] [address] [phone]  [facility]  [address] [place] [address] [phone]  [facility]  [address], [place] [address] [phone]  [facility]  [address] [place] [address] [phone]  [facility]  [address] [place] [address]  [phone]   Bunnie Gallop, MSW, LCSW-A  2:51 PM 05/24/2024

## 2024-05-24 NOTE — ED Provider Notes (Signed)
 After talking to the patient at 11 am and patient insisting on leaving and that he had lied about having suicidal ideation clinical research associate wrote and IVC.  If discharged from the facillity the patient would be a danger to themself or others due to their status. On 05/21/24 patient was brought into [facility] by the [facility] police department. According to police he stated I'm just a piece of shit. He also told officers that he would [place] himself [place] [address]. Patient agreed to a voluntary admission for alcohol detox and depression treatment with a plan to go to residential treatment. Patient now states that he wants to leave and everything was a lie. He denies everything saying he was lying to get a place to stay. Patient is angry and demanding to leave. Patient still does not have a place to live. Pt it at high risk for self-harm and collateral to ensure his safety has not yet been available. Pt stated that his son could be contacted and that his plan was to stay with his son tonight, but he does not have his son's telephone number saved on his phone nor does he know it.

## 2024-05-24 NOTE — ED Notes (Signed)
 Patient sitting in dayroom interacting with peers. No acute distress noted. No concerns voiced. No inappropriate behaviors observed or reported at this time. Informed patient to notify staff with any needs or assistance. Patient verbalized understanding or agreement. Safety checks in place per facility policy.

## 2024-05-24 NOTE — Progress Notes (Signed)
 Patient is currently resting with eyes closed at this time.  No acute distress noted.  Respirations present and unlabored.  No current issues noted at this time.  Will continue safety checks Q 15 min for safety/behavior.

## 2024-05-25 ENCOUNTER — Encounter (HOSPITAL_COMMUNITY): Payer: Self-pay

## 2024-05-25 DIAGNOSIS — F1020 Alcohol dependence, uncomplicated: Secondary | ICD-10-CM

## 2024-05-25 DIAGNOSIS — F39 Unspecified mood [affective] disorder: Principal | ICD-10-CM

## 2024-05-25 MED ORDER — HYDROXYZINE HCL 25 MG PO TABS
25.0000 mg | ORAL_TABLET | Freq: Three times a day (TID) | ORAL | Status: DC | PRN
  Filled 2024-05-25: qty 1

## 2024-05-25 MED ORDER — NALTREXONE HCL 50 MG PO TABS
50.0000 mg | ORAL_TABLET | Freq: Every day | ORAL | Status: DC
Start: 2024-05-27 — End: 2024-05-28
  Administered 2024-05-27: 50 mg via ORAL
  Filled 2024-05-25: qty 1
  Filled 2024-05-25: qty 7

## 2024-05-25 MED ORDER — NALTREXONE HCL 50 MG PO TABS
25.0000 mg | ORAL_TABLET | Freq: Every day | ORAL | Status: AC
  Administered 2024-05-25 – 2024-05-26 (×2): 25 mg via ORAL
  Filled 2024-05-25 (×2): qty 1

## 2024-05-25 NOTE — BH IP Treatment Plan (Signed)
 Interdisciplinary Treatment and Diagnostic Plan Update  05/25/2024 Time of Session: 10:15 AM Eugene [name] MRN: [id number]  Principal Diagnosis: MDD (major depressive disorder), recurrent severe, without psychosis (HCC)  Secondary Diagnoses: Principal Problem:   MDD (major depressive disorder), recurrent severe, without psychosis (HCC)   Current Medications:  Current Facility-Administered Medications  Medication Dose Route Frequency Provider Last Rate Last Admin   acetaminophen (TYLENOL) tablet 650 mg  650 mg Oral Q6H PRN Gottfried, Rhoda J, MD       alum & mag hydroxide-simeth (MAALOX/MYLANTA) 200-200-20 MG/5ML suspension 30 mL  30 mL Oral Q4H PRN Gottfried, Rhoda J, MD       aspirin tablet 325 mg  325 mg Oral Daily Gottfried, Rhoda J, MD   325 mg at 05/25/24 9170   chlordiazePOXIDE (LIBRIUM) capsule 25 mg  25 mg Oral BH-qamhs Gottfried, Rhoda J, MD       Followed by   [name] 05/26/2024] chlordiazePOXIDE (LIBRIUM) capsule 25 mg  25 mg Oral Daily Gottfried, Rhoda J, MD       losartan (COZAAR) tablet 50 mg  50 mg Oral BID Gottfried, Rhoda J, MD   50 mg at 05/25/24 9170   magnesium hydroxide (MILK OF MAGNESIA) suspension 30 mL  30 mL Oral Daily PRN Gottfried, Rhoda J, MD       multivitamin with minerals tablet 1 tablet  1 tablet Oral Daily Gottfried, Rhoda J, MD   1 tablet at 05/25/24 9170   sertraline (ZOLOFT) tablet 25 mg  25 mg Oral Daily Gottfried, Rhoda J, MD       PTA Medications: Medications Prior to Admission  Medication Sig Dispense Refill Last Dose/Taking   aspirin EC 325 MG tablet Take 325 mg by mouth daily.      losartan (COZAAR) 50 MG tablet Take 100 mg by mouth daily.      metFORMIN (GLUCOPHAGE) 500 MG tablet Take 1 tablet (500 mg total) by mouth 2 (two) times daily with a meal.      sertraline (ZOLOFT) 25 MG tablet Take 1 tablet (25 mg total) by mouth daily.       Patient Stressors: Marital or family conflict   Substance abuse    Patient Strengths: Active sense of  humor  Average or above average intelligence  Capable of independent living  Communication skills  Supportive family/friends   Treatment Modalities: Medication Management, Group therapy, Case management,  1 to 1 session with clinician, Psychoeducation, Recreational therapy.   Physician Treatment Plan for Primary Diagnosis: MDD (major depressive disorder), recurrent severe, without psychosis (HCC) Long Term Goal(s):     Short Term Goals:    Medication Management: Evaluate patient's response, side effects, and tolerance of medication regimen.  Therapeutic Interventions: 1 to 1 sessions, Unit Group sessions and Medication administration.  Evaluation of Outcomes: Not Progressing  Physician Treatment Plan for Secondary Diagnosis: Principal Problem:   MDD (major depressive disorder), recurrent severe, without psychosis (HCC)  Long Term Goal(s):     Short Term Goals:       Medication Management: Evaluate patient's response, side effects, and tolerance of medication regimen.  Therapeutic Interventions: 1 to 1 sessions, Unit Group sessions and Medication administration.  Evaluation of Outcomes: Not Progressing   RN Treatment Plan for Primary Diagnosis: MDD (major depressive disorder), recurrent severe, without psychosis (HCC) Long Term Goal(s): Knowledge of disease and therapeutic regimen to maintain health will improve  Short Term Goals: Ability to remain free from injury will improve, Ability to verbalize frustration and anger appropriately  will improve, Ability to demonstrate self-control, Ability to participate in decision making will improve, Ability to verbalize feelings will improve, Ability to disclose and discuss suicidal ideas, Ability to identify and develop effective coping behaviors will improve, and Compliance with prescribed medications will improve  Medication Management: RN will administer medications as ordered by provider, will assess and evaluate patient's response  and provide education to patient for prescribed medication. RN will report any adverse and/or side effects to prescribing provider.  Therapeutic Interventions: 1 on 1 counseling sessions, Psychoeducation, Medication administration, Evaluate responses to treatment, Monitor vital signs and CBGs as ordered, Perform/monitor CIWA, COWS, AIMS and Fall Risk screenings as ordered, Perform wound care treatments as ordered.  Evaluation of Outcomes: Not Progressing   LCSW Treatment Plan for Primary Diagnosis: MDD (major depressive disorder), recurrent severe, without psychosis (HCC) Long Term Goal(s): Safe transition to appropriate next level of care at discharge, Engage patient in therapeutic group addressing interpersonal concerns.  Short Term Goals: Engage patient in aftercare planning with referrals and resources, Increase social support, Increase ability to appropriately verbalize feelings, Increase emotional regulation, Facilitate acceptance of mental health diagnosis and concerns, Facilitate patient progression through stages of change regarding substance use diagnoses and concerns, Identify triggers associated with mental health/substance abuse issues, and Increase skills for wellness and recovery  Therapeutic Interventions: Assess for all discharge needs, 1 to 1 time with Social worker, Explore available resources and support systems, Assess for adequacy in community support network, Educate family and significant other(s) on suicide prevention, Complete Psychosocial Assessment, Interpersonal group therapy.  Evaluation of Outcomes: Not Progressing   Progress in Treatment: Attending groups: No. Participating in groups: No. Taking medication as prescribed: No. Toleration medication: No. Family/Significant other contact made: No, will contact:  consents pending. Patient understands diagnosis: Yes. Discussing patient identified problems/goals with staff: Yes. Medical problems stabilized or  resolved: Yes. Denies suicidal/homicidal ideation: Yes. Issues/concerns per patient self-inventory: No. None reported.  New problem(s) identified: No, Describe:  None identified.  New Short Term/Long Term Goal(s): Detox, medication management for mood stabilization; elimination of SI thoughts; development of comprehensive mental wellness/sobriety plan  Patient Goals: Detox and I wouldn't be opposed to treatment for alcoholism.  Discharge Plan or Barriers: Patient recently admitted. CSW will continue to follow and assess for appropriate referrals and possible discharge planning.    Reason for Continuation of Hospitalization: Depression Medication stabilization Withdrawal symptoms  Estimated Length of Stay: 3-5 days.  Last 3 [place] Suicide Severity Risk Score: Flowsheet Row Ad[facility] (Current) [facility] Most recent reading at 05/24/2024 10:00 PM ED from 05/21/2024 in [facility] Most recent reading at 05/22/2024 12:52 AM ED from 05/21/2024 in [facility] Most recent reading at 05/21/2024  8:51 PM  C-SSRS RISK CATEGORY No Risk No Risk No Risk    Last PHQ 2/9 Scores:    05/21/2024    9:00 PM  Depression screen PHQ 2/9  Decreased Interest 3  Down, Depressed, Hopeless 3  PHQ - 2 Score 6  Altered sleeping 3  Tired, decreased energy 3  Change in appetite 3  Feeling bad or failure about yourself  3  Trouble concentrating 3  Moving slowly or fidgety/restless 3  Suicidal thoughts 3  PHQ-9 Score 27  Difficult doing work/chores Extremely dIfficult    Scribe for Treatment Team: Louetta Wynona SILK 05/25/2024 2:10 PM

## 2024-05-25 NOTE — Progress Notes (Signed)
 Nutrition Note  RD consulted for nutrition education regarding diabetes.   Pt with HgbA1c: 6.1.  Has been noncompliant with Metformin. H/o Etoh use.  Have added Carbohydrate Counting handout in AVS.  Lab Results  Component Value Date   HGBA1C 6.1 (H) 05/21/2024     Body mass index is 41.32 kg/m. Pt meets criteria for obesity based on current BMI.  Current diet order is regular. Labs and medications reviewed. No further nutrition interventions warranted at this time. If additional nutrition issues arise, please re-consult RD.  Morna Lee, MS, RD, LDN Inpatient Clinical Dietitian Contact via Secure chat

## 2024-05-25 NOTE — Inpatient Diabetes Management (Signed)
 Inpatient Diabetes Program Recommendations  AACE/ADA: New Consensus Statement on Inpatient Glycemic Control (2015)  Target Ranges:  Prepandial:   less than 140 mg/dL      Peak postprandial:   less than 180 mg/dL (1-2 hours)      Critically ill patients:  140 - 180 mg/dL   Lab Results  Component Value Date   HGBA1C 6.1 (H) 05/21/2024    Review of Glycemic Control  Diabetes history: none Outpatient Diabetes medications: none Current orders for Inpatient glycemic control: none  Inpatient Diabetes Program Recommendations:   Received diabetes coordinator consult for new onset diabetes. Per the American Diabetes Association, diagnosis of diabetes is a HgbA1C of 6.5%.   Noted that patient's HgbA1C is 6.1%. Will need to follow up with a PCP. Noted that patient had not been taking his Metformin 500 mg BID that was ordered. Will attach prediabetes information on diet, blood sugars, etc. to be given to patient at discharge.  Patient could be placed on a CHO modified diet while in the hospital.   Marjorie Lunger RN BSN CDE Diabetes Coordinator Pager: [phone]  8am-5pm

## 2024-05-25 NOTE — Group Note (Signed)
 Date:  05/25/2024 Time:  3:06 PM  Group Topic/Focus: Grief and Loss Group with the Chaplain    Pt did not attend Grief and Loss Group with the Chaplain  Eugene [name] [name] 05/25/2024, 3:06 PM

## 2024-05-25 NOTE — Group Note (Signed)
 Date:  05/25/2024 Time:  10:17 AM  Group Topic/Focus: Recreational Therapy Group   Pt did not attend recreational therapy group  Thelton Graca R Chrisann Melaragno 05/25/2024, 10:17 AM

## 2024-05-25 NOTE — Plan of Care (Signed)
   Problem: Education: Goal: Emotional status will improve Outcome: Progressing Goal: Mental status will improve Outcome: Progressing

## 2024-05-25 NOTE — Group Note (Signed)
 Date:  05/25/2024 Time:  3:41 PM  Group Topic/Focus: Occupational Therapy     Pt did not attend occupational therapy group  Eugene [name] R Eugene [name] 05/25/2024, 3:41 PM

## 2024-05-25 NOTE — Group Note (Signed)
 Recreation Therapy Group Note   Group Topic:Team Building  Group Date: 05/25/2024 Start Time: 0932 End Time: 1005 Facilitators: Ellorie Kindall-McCall, LRT,CTRS Location: [address] Dayroom   Group Topic: Communication, Team Building, Problem Solving  Goal Area(s) Addresses:  Patient will effectively work with peer towards shared goal.  Patient will identify skills used to make activity successful.  Patient will share challenges and verbalize solution-driven approaches used. Patient will identify how skills used during activity can be used to reach post d/c goals.   Behavioral Response:   Intervention: STEM Activity   Activity: [employer]. Patients were provided the following materials: 5 drinking straws, 5 rubber bands, 5 paper clips, 2 index cards and 2 drinking cups. Using the provided materials patients were asked to build a launching mechanism to launch a ping pong ball across the room, approximately 10 feet. Patients were divided into teams of 3-5. Instructions required all materials be incorporated into the device, functionality of items left to the peer group's discretion.  Education: Pharmacist, Community, Scientist, Physiological, Air Cabin Crew, Building Control Surveyor.   Education Outcome: Acknowledges education/In group clarification offered/Needs additional education.    Affect/Mood: N/A   Participation Level: Did not attend    Clinical Observations/Individualized Feedback:     Plan: Continue to engage patient in RT group sessions 2-3x/week.   Chrisangel Eskenazi-McCall, LRT,CTRS  05/25/2024 11:16 AM

## 2024-05-25 NOTE — [facility] Group Notes (Signed)
 The focus of this group is to help patients review their daily goal of treatment and discuss progress on daily workbooks. Pt was attentive and appropriate during tonight's wrap up group discussion with aa.

## 2024-05-25 NOTE — Discharge Instructions (Signed)
 Carbohydrate Counting  Foods with carbohydrates make your blood glucose level go up. Learning how to count carbohydrates can help you control your blood glucose levels. First, identify the foods you eat that contain carbohydrates. Then, using the Foods with Carbohydrates chart, determine about how much carbohydrates are in your meals and snacks. Make sure you are eating foods with fiber, protein, and healthy fat along with your carbohydrate foods. Foods with Carbohydrates The following table shows carbohydrate foods that have about 15 grams of carbohydrate each. Using measuring cups, spoons, or a food scale when you first begin learning about carbohydrate counting can help you learn about the portion sizes you typically eat. The following foods have 15 grams carbohydrate each:  Grains 1 slice bread (1 ounce)  1 small tortilla (6-inch size)   large bagel (1 ounce)  1/3 cup pasta or rice (cooked)   hamburger or hot dog bun ( ounce)   cup cooked cereal   to  cup ready-to-eat cereal  2 taco shells (5-inch size) Fruit 1 small fresh fruit ( to 1 cup)   medium banana  17 small grapes (3 ounces)  1 cup melon or berries   cup canned or frozen fruit  2 tablespoons dried fruit (blueberries, cherries, cranberries, raisins)   cup unsweetened fruit juice  Starchy Vegetables  cup cooked beans, peas, corn, potatoes/sweet potatoes   large baked potato (3 ounces)  1 cup acorn or butternut squash  Snack Foods 3 to 6 crackers  8 potato chips or 13 tortilla chips ( ounce to 1 ounce)  3 cups popped popcorn  Dairy 3/4 cup (6 ounces) nonfat plain yogurt, or yogurt with sugar-free sweetener  1 cup milk  1 cup plain rice, soy, coconut or flavored almond milk Sweets and Desserts  cup ice cream or frozen yogurt  1 tablespoon jam, jelly, pancake syrup, table sugar, or honey  2 tablespoons light pancake syrup  1 inch square of frosted cake or 2 inch square of unfrosted cake  2 small cookies (2/3  ounce each) or  large cookie  Sometimes you'll have to estimate carbohydrate amounts if you don't know the exact recipe. One cup of mixed foods like soups can have 1 to 2 carbohydrate servings, while some casseroles might have 2 or more servings of carbohydrate. Foods that have less than 20 calories in each serving can be counted as "free" foods. Count 1 cup raw vegetables, or  cup cooked non-starchy vegetables as "free" foods. If you eat 3 or more servings at one meal, then count them as 1 carbohydrate serving.  Foods without Carbohydrates  Not all foods contain carbohydrates. Meat, some dairy, fats, non-starchy vegetables, and many beverages don't contain carbohydrate. So when you count carbohydrates, you can generally exclude chicken, pork, beef, fish, seafood, eggs, tofu, cheese, butter, sour cream, avocado, nuts, seeds, olives, mayonnaise, water, black coffee, unsweetened tea, and zero-calorie drinks. Vegetables with no or low carbohydrate include green beans, cauliflower, tomatoes, and onions. How much carbohydrate should I eat at each meal?  Carbohydrate counting can help you plan your meals and manage your weight. Following are some starting points for carbohydrate intake at each meal. Work with your registered dietitian nutritionist to find the best range that works for your blood glucose and weight.   To Lose Weight To Maintain Weight  Women 2 - 3 carb servings 3 - 4 carb servings  Men 3 - 4 carb servings 4 - 5 carb servings  Checking your blood glucose after meals  will help you know if you need to adjust the timing, type, or number of carbohydrate servings in your meal plan. Achieve and keep a healthy body weight by balancing your food intake and physical activity.  Tips How should I plan my meals?  Plan for half the food on your plate to include non-starchy vegetables, like salad greens, broccoli, or carrots. Try to eat 3 to 5 servings of non-starchy vegetables every day. Have a protein  food at each meal. Protein foods include chicken, fish, meat, eggs, or beans (note that beans contain carbohydrate). These two food groups (non-starchy vegetables and proteins) are low in carbohydrate. If you fill up your plate with these foods, you will eat less carbohydrate but still fill up your stomach. Try to limit your carbohydrate portion to  of the plate.  What fats are healthiest to eat?  Diabetes increases risk for heart disease. To help protect your heart, eat more healthy fats, such as olive oil, nuts, and avocado. Eat less saturated fats like butter, cream, and high-fat meats, like bacon and sausage. Avoid trans fats, which are in all foods that list "partially hydrogenated oil" as an ingredient. What should I drink?  Choose drinks that are not sweetened with sugar. The healthiest choices are water, carbonated or seltzer waters, and tea and coffee without added sugars.  Sweet drinks will make your blood glucose go up very quickly. One serving of soda or energy drink is  cup. It is best to drink these beverages only if your blood glucose is low.  Artificially sweetened, or diet drinks, typically do not increase your blood glucose if they have zero calories in them. Read labels of beverages, as some diet drinks do have carbohydrate and will raise your blood glucose. Label Reading Tips Read Nutrition Facts labels to find out how many grams of carbohydrate are in a food you want to eat. Don't forget: sometimes serving sizes on the label aren't the same as how much food you are going to eat, so you may need to calculate how much carbohydrate is in the food you are serving yourself.   Carbohydrate Counting Sample 1-Day Menu  Breakfast  cup yogurt, low fat, low sugar (1 carbohydrate serving)   cup cereal, ready-to-eat, unsweetened (1 carbohydrate serving)  1 cup strawberries (1 carbohydrate serving)   cup almonds ( carbohydrate serving)  Lunch 1, 5 ounce can chunk light tuna  2 ounces  cheese, low fat cheddar  6 whole wheat crackers (1 carbohydrate serving)  1 small apple (1 carbohydrate servings)   cup carrots ( carbohydrate serving)   cup snap peas  1 cup 1% milk (1 carbohydrate serving)   Evening Meal Stir fry made with: 3 ounces chicken  1 cup brown rice (3 carbohydrate servings)   cup broccoli ( carbohydrate serving)   cup green beans   cup onions  1 tablespoon olive oil  2 tablespoons teriyaki sauce ( carbohydrate serving)  Evening Snack 1 extra small banana (1 carbohydrate serving)  1 tablespoon peanut butter   Carbohydrate Counting Vegan Sample 1-Day Menu  Breakfast 1 cup cooked oatmeal (2 carbohydrate servings)   cup blueberries (1 carbohydrate serving)  2 tablespoons flaxseeds  1 cup soymilk fortified with calcium and vitamin D  1 cup coffee  Lunch 2 slices whole wheat bread (2 carbohydrate servings)   cup baked tofu   cup lettuce  2 slices tomato  2 slices avocado   cup baby carrots ( carbohydrate serving)  1 orange (1  carbohydrate serving)  1 cup soymilk fortified with calcium and vitamin D   Evening Meal Burrito made with: 1 6-inch corn tortilla (1 carbohydrate serving)  1 cup refried vegetarian beans (2 carbohydrate servings)   cup chopped tomatoes   cup lettuce   cup salsa  1/3 cup brown rice (1 carbohydrate serving)  1 tablespoon olive oil for rice   cup zucchini   Evening Snack 6 small whole grain crackers (1 carbohydrate serving)  2 apricots ( carbohydrate serving)   cup unsalted peanuts ( carbohydrate serving)    Carbohydrate Counting  Vegetarian (Lacto-Ovo) Sample 1-Day Menu  Breakfast 1 cup cooked oatmeal (2 carbohydrate servings)   cup blueberries (1 carbohydrate serving)  2 tablespoons flaxseeds  1 egg  1 cup 1% milk (1 carbohydrate serving)  1 cup coffee  Lunch 2 slices whole wheat bread (2 carbohydrate servings)  2 ounces low-fat cheese   cup lettuce  2 slices tomato  2 slices avocado   cup baby  carrots ( carbohydrate serving)  1 orange (1 carbohydrate serving)  1 cup unsweetened tea  Evening Meal Burrito made with: 1 6-inch corn tortilla (1 carbohydrate serving)   cup refried vegetarian beans (1 carbohydrate serving)   cup tomatoes   cup lettuce   cup salsa  1/3 cup brown rice (1 carbohydrate serving)  1 tablespoon olive oil for rice   cup zucchini  1 cup 1% milk (1 carbohydrate serving)  Evening Snack 6 small whole grain crackers (1 carbohydrate serving)  2 apricots ( carbohydrate serving)   cup unsalted peanuts ( carbohydrate serving)    Copyright 2020  Academy of Nutrition and Dietetics. All rights reserved.  Using Nutrition Labels: Carbohydrate  Serving Size  Look at the serving size. All the information on the label is based on this portion. Servings Per Container  The number of servings contained in the package. Guidelines for Carbohydrate  Look at the total grams of carbohydrate in the serving size.  1 carbohydrate choice = 15 grams of carbohydrate. Range of Carbohydrate Grams Per Choice  Carbohydrate Grams/Choice Carbohydrate Choices  6-10   11-20 1  21-25 1  26-35 2  36-40 2  41-50 3  51-55 3  56-65 4  66-70 4  71-80 5    Copyright 2020  Academy of Nutrition and Dietetics. All rights reserved.  ___ Outpatient Therapy and Psychiatry Resources for Patients: Your psychiatric needs would be well-served by consultation and regular meetings with an outpatient therapist to assist you with your mood-related conditions. Here are a series of links for finding a therapist.    Includes links to the following: [facility] (http://wilson-mayo.com/) (only for [facility] and please reserve for uninsured) [facility]  (http://blankenship-martinez.net/) Psychology Today Special Educational Needs Teacher (https://www.psychologytoday.com/us lendell) Psychology Today Support Group Tax Inspector (https://www.psychologytoday.com/us /groups/) [employer] [facility] [employer] Location (https://carolinabehavioralcare.com/staff-location/[place]/) [facility] - Support Group Finder - (recorddebt.fi) [facility] [employer] [facility] (https://fspcares.org/contact/) [employer] for [facility] [place] [facility] (https://namiguilford.org/support-and-education/support-groups/) [employer] (https://www.[place].com/lb/locations/profile/[facility]/) Dept of Health and Human Services - Find a mental health facility (http://lester.info/)  ___ Local Inpatient Options for Substance Abuse Treatment    Includes links to the following:  Short-Term: [facility] (techcelebrity.com.pt) [facility] (https://[facility]ers.com/) [facility] (http://wilson-mayo.com/)  Medium Term: [facility] (dyecasts.nl) [facility] (Medicaid or Uninsured only) (https://www.[facility].org/locations/[facility])  Longer Residential Options:  [facility] (mediasuits.se) [facility] - [facility] (https://baker.biz/)   __ Closing notes from your doctor: It was a pleasure taking care of you while you were with us . I want to stress once more that  medications are part of, but not ALL of what we must do to take care of ourselves mentally.   We all need: - Physical activity - at least 3, but preferably 5 days per week where we exercise for 30 minutes at a level where we get out of breath. That will vary by person and health conditions, but it will improve your mental health as well as your physical health. - Good restful sleep - avoid using a phone or screens in bed, sleep in a dark room, use a white noise machine or app and if you are having trouble sleeping, consult your doctor. - To reduce use of substances - alcohol and tobacco are shown to be harmful to many parts of our health, if you need help, ask for it. The same is true for illegal drugs, if you need help, ask for it. - To find purpose - whether it is caring for loved ones, volunteering, pursuing hobbies, or doing work that means something to you, try and live in accordance with your values. - Healthy relationships - seek healthy relationships where the other person helps you grow and challenges you to be your best. If it does not feel good, seek assistance.  JINNY Morene GORMAN Delsie, MD [facility] Health Psychiatry Resident
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
## 2024-05-25 NOTE — Plan of Care (Signed)
   Problem: Education: Goal: Knowledge of [facility] Education information/materials will improve Outcome: Progressing Goal: Emotional status will improve Outcome: Progressing Goal: Mental status will improve Outcome: Progressing Goal: Verbalization of understanding the information provided will improve Outcome: Progressing

## 2024-05-25 NOTE — Progress Notes (Signed)
  Patient A&Ox4. Has been calm, cooperative, and appropriate during assessment. Patient  denies intent to harm self/others. Denies A/VH. He endorses some depression and anxiety but declined medication. Patient says he wants to go home. Patient denies any physical pain or discomfort. No acute alcohol withdrawal noted or distress observed. Skin check completed now patient in dayroom eating. Patient agrees to notify staff should thoughts of harm toward self or others arise. We will continue to monitor for safety.

## 2024-05-25 NOTE — Group Note (Signed)
 Date:  05/25/2024 Time:  12:28 PM  Group Topic/Focus:  Emotional Education:   The focus of this group is to discuss what feelings/emotions are, and how they are experienced. Physical Wellness: To promote an active, healthy lifestyle through regular physical activity, proper nutrition, and good sleep habits to improve overall health, well-being, and disease prevention.    Participation Level:  Did Not Attend  Lamerle Jabs R Virginia Francisco 05/25/2024, 12:28 PM

## 2024-05-25 NOTE — [facility] Suicide Risk Assessment (Signed)
 Suicide Risk Assessment  Admission Assessment    [facility] Admission Suicide Risk Assessment   Nursing information obtained from:  Patient Demographic factors:  Male Current Mental Status:  Self-harm thoughts Loss Factors:  Legal issues, Financial problems / change in socioeconomic status Historical Factors:  Impulsivity Risk Reduction Factors:  Sense of responsibility to family  Total Time spent with patient: 45 minutes Principal Problem: MDD (major depressive disorder), recurrent severe, without psychosis (HCC) Diagnosis:  Principal Problem:   MDD (major depressive disorder), recurrent severe, without psychosis (HCC)  Subjective Data: RR:Eugene [name] is a 48 year old male patient with a psychiatric history significant for alcohol use disorder who was admitted to the [facility] on May 21, 2024 for complaints of suicidal thoughts with a plan to walk into traffic and worsening depression. Patient reported that his wife took out a restraining order on him and had him removed from the house. He reported drinking a 12 pack of beer daily mixed with liquor and using a half to a gram of marijuana daily. UDS positive for THC and BAL 139 on arrival.      Mode of transport to Hospital:  IVC from [facility] Current Outpatient (Home) Medication List: losartan 50 mg BID PRN medication prior to evaluation: none   HPI:  According to the patient, he is here all because of a drinking problem. Patient admits that he has had increasing use of alcohol over the last few months.  Patient reports that this has gotten even worse as marital troubles between he and his wife (married for 5 years, together for total of 17-1/2 years) have been deteriorating.  Currently patient believes the 2 of them will separate permanently, though he vacillates during the interview about whether he believes this is the case.     Patient reports that he has had difficulty stopping drinking once he starts.  And despite knowing this, he  still continues to drink.  Patient reports that he drinks several days per week and will drink a full 12 pack and add on what ever liquor is available.  Patient's CAGE questionnaire is 4 out of 4.   Patient denies other symptoms of depression other than substance-induced.  Denies anhedonia, feelings of guilt and worthlessness, low energy.   Patient endorses some symptoms of anxiety including muscle tension, irritability, lability, however these are also partially explained by poor quality sleep.   Patient endorses long history of difficulty sleeping, states that he frequently wakes up throughout the night.  Believes he has sleep apnea, however has been unable to be evaluated when he has had insurance in the past.   Patient denies significant history of bipolar disorder symptoms despite childhood diagnosis of bipolar disorder.  Patient denies any manic or hypomanic episodes based on questioning.    Continued Clinical Symptoms:  Alcohol Use Disorder Identification Test Final Score (AUDIT): 19 The Alcohol Use Disorders Identification Test, Guidelines for Use in Primary Care, Second Edition.  World Science Writer [facility]). Score between 0-7:  no or low risk or alcohol related problems. Score between 8-15:  moderate risk of alcohol related problems. Score between 16-19:  high risk of alcohol related problems. Score 20 or above:  warrants further diagnostic evaluation for alcohol dependence and treatment.   CLINICAL FACTORS:   Alcohol/Substance Abuse/Dependencies Unstable or Poor Therapeutic Relationship   Musculoskeletal: Strength & Muscle Tone: within normal limits Gait & Station: normal Patient leans: N/A   Psychiatric Specialty Exam: Mental Status Exam: General Appearance and Behavior: Patient is casually dressed obese  Caucasian male with well-groomed facial hair and brushed hair,   Orientation:  Full (Time, Place, and Person)  Memory:  Grossly intact  Attention: Good  Eye  Contact:  Good  Speech:  Clear and Coherent and Normal Rate  Language:  Good  Volume:  Normal  Mood: I am fine, I do have a bit of a drinking problem though  Affect:  Blunt  Thought Process:  Coherent, Goal Directed, and Linear  Thought Content:  WDL  Suicidal Thoughts:  No  Homicidal Thoughts:  No  Judgement:  Impaired  Insight:  Shallow  Psychomotor Activity:  Normal  Akathisia:  No  Fund of Knowledge:  Good Assets:  Communication Skills Desire for Improvement Vocational/Educational  Cognition:  WNL  ADL's:  Intact    Details about paranoia, delusions, or hallucinations:   Sleep  Sleep:No data recorded   Physical Exam: Vitals and nursing note reviewed.  Constitutional:      General: He is not in acute distress.    Appearance: Normal appearance. He is obese. He is not ill-appearing.  HENT:     Head: Normocephalic and atraumatic.     Nose: No congestion.  Pulmonary:     Effort: Pulmonary effort is normal.  Skin:    General: Skin is warm and dry.  Neurological:     General: No focal deficit present.     Mental Status: He is alert and oriented to person, place, and time.     Sensory: No sensory deficit.  Psychiatric:        Mood and Affect: Mood normal.        Behavior: Behavior normal.        Thought Content: Thought content normal.        Judgment: Judgment normal.     Review of Systems: Constitutional:  Negative for chills, diaphoresis, fever, malaise/fatigue and weight loss.  Respiratory:  Negative for cough.   Cardiovascular:  Negative for chest pain.  Gastrointestinal:  Negative for abdominal pain, constipation, diarrhea, nausea and vomiting.  Genitourinary:  Negative for urgency.  Psychiatric/Behavioral:  Positive for substance abuse. Negative for depression, hallucinations and suicidal ideas. The patient has insomnia. The patient is not nervous/anxious.    Blood pressure 124/89, pulse 93, temperature 98.2 F (36.8 C), temperature source Oral, resp.  rate 20, height 5' 6 (1.676 m), weight 116.1 kg, SpO2 94%. Body mass index is 41.32 kg/m.   COGNITIVE FEATURES THAT CONTRIBUTE TO RISK:  Closed-mindedness    SUICIDE RISK:   Moderate:  Frequent suicidal ideation with limited intensity, and duration, some specificity in terms of plans, no associated intent, good self-control, limited dysphoria/symptomatology, some risk factors present, and identifiable protective factors, including available and accessible social support.  PLAN OF CARE: See H&P  I certify that inpatient services furnished can reasonably be expected to improve the patient's condition.   Lynwood Morene Lavone Delsie, MD 05/25/2024, 9:49 AM

## 2024-05-25 NOTE — Progress Notes (Signed)
   05/25/24 1000  Psych Admission Type (Psych Patients Only)  Admission Status Involuntary  Psychosocial Assessment  Patient Complaints Depression  Eye Contact Avoids  Facial Expression Flat  Affect Flat  Speech Logical/coherent  Interaction Minimal  Motor Activity Other (Comment)  Appearance/Hygiene Unremarkable  Behavior Characteristics Appropriate to situation  Mood Depressed  Thought Process  Coherency WDL  Content WDL  Delusions None reported or observed  Perception WDL  Hallucination None reported or observed  Judgment WDL  Confusion None  Danger to Self  Current suicidal ideation? Denies  Danger to Others  Danger to Others None reported or observed   DAR NOTE: Patient presents with a flat affect and depressed mood.  Denies suicidal thoughts, pain, auditory and visual hallucinations.  Rates depression at 4, hopelessness at 0, and anxiety at 0.  Maintained on routine safety checks.  Medications given as prescribed.  Support and encouragement offered as needed.  Attended group and participated.  States goal for today is going home and staying sober.  Patient is safe on and off the unit.  Refused his Librium and Zoloft.  MD  made aware.

## 2024-05-25 NOTE — Group Note (Signed)
 Date:  05/25/2024 Time:  9:44 AM  Group Topic/Focus:  Goals Group:   The focus of this group is to help patients establish daily goals to achieve during treatment and discuss how the patient can incorporate goal setting into their daily lives to aide in recovery. Orientation:   The focus of this group is to educate the patient on the purpose and policies of crisis stabilization and provide a format to answer questions about their admission.  The group details unit policies and expectations of patients while admitted.    Participation Level:  Did Not Attend   Eugene [name] 05/25/2024, 9:44 AM

## 2024-05-26 NOTE — Group Note (Signed)
 Recreation Therapy Group Note   Group Topic:Animal Assisted Therapy   Group Date: 05/26/2024 Start Time: 9051 End Time: 1030 Facilitators: Jaivion Kingsley-McCall, LRT,CTRS Location: [address] Dayroom   Animal-Assisted Activity (AAA) Program Checklist/Progress Notes Patient Eligibility Criteria Checklist & Daily Group note for Rec Tx Intervention  AAA/T Program Assumption of Risk Form signed by Patient/ or Parent Legal Guardian Yes  Patient is free of allergies or severe asthma Yes  Patient reports no fear of animals Yes  Patient reports no history of cruelty to animals Yes  Patient understands his/her participation is voluntary Yes  Patient washes hands before animal contact Yes  Patient washes hands after animal contact Yes  Behavioral Response: Attentive    Education: Hand Washing, Appropriate Animal Interaction   Education Outcome: Acknowledges education.    Affect/Mood: Appropriate   Participation Level: Active   Participation Quality: Independent   Behavior: Attentive    Speech/Thought Process: None   Insight: None   Judgement: None   Modes of Intervention: Teaching Laboratory Technician   Patient Response to Interventions:  Attentive   Education Outcome:  In group clarification offered    Clinical Observations/Individualized Feedback: Pt was quiet and had some interaction with therapy dog team. Pt stated he was leaving group because it was getting too crowded.   Plan: Continue to engage patient in RT group sessions 2-3x/week.   Tajuana Kniskern-McCall, LRT,CTRS  05/26/2024 12:46 PM

## 2024-05-26 NOTE — Plan of Care (Signed)
   Problem: Education: Goal: Knowledge of [facility] Education information/materials will improve Outcome: Progressing Goal: Emotional status will improve Outcome: Progressing Goal: Mental status will improve Outcome: Progressing Goal: Verbalization of understanding the information provided will improve Outcome: Progressing

## 2024-05-26 NOTE — [facility] Group Notes (Signed)
 [facility] Group Notes:  (Nursing/MHT/Case Management/Adjunct)  Date:  05/26/2024  Time:  9:44 PM  Type of Therapy:  Psychoeducational Skills  Participation Level:  Active  Participation Quality:  Appropriate  Affect:  Flat and Irritable  Cognitive:  Lacking  Insight:  Lacking  Engagement in Group:  Resistant  Modes of Intervention:  Education  Summary of Progress/Problems: Patient rated his day as a 7 out of 10. He states that he had a bad conversation with his doctor and that his physician does not know what he is doing. He would like to be discharged and feels that he has been in the hospital for too long despite being here on a voluntary basis.   Lahela Woodin S 05/26/2024, 9:44 PM

## 2024-05-26 NOTE — [facility] Group Notes (Signed)
 Eugene [name] attended the [facility] group today 05/26/24 from [id number].
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
## 2024-05-26 NOTE — Plan of Care (Signed)
   Problem: Education: Goal: Emotional status will improve Outcome: Progressing Goal: Mental status will improve Outcome: Progressing

## 2024-05-26 NOTE — Group Note (Signed)
 Date:  05/26/2024 Time:  5:42 PM  Group Topic/Focus: Compliance with care/Identifying resources Wellness Toolbox:   The focus of this group is to discuss various aspects of wellness, balancing those aspects and exploring ways to increase the ability to experience wellness.  Patients will create a wellness toolbox for use upon discharge.    Participation Level:  Did Not Attend  Shanda JONETTA Challenger 05/26/2024, 5:42 PM

## 2024-05-26 NOTE — Group Note (Signed)
 Date:  05/26/2024 Time:  10:09 AM  Group Topic/Focus:  Goals Group:   Today's group explored Maslow's Hierarchy of Needs as a framework for understanding personal growth and recovery. Patients discussed each level of the hierarchy--physiological, safety, love/belonging, esteem, and self-actualization--and identified coping skills that support progress within each area. The group then applied this understanding to goal setting, identifying how meeting foundational needs and using healthy coping strategies can promote movement toward higher levels of recovery. Patients set individualized goals for the next week, month, and year, connecting each goal to specific coping skills and personal needs. Discussion emphasized the importance of balance, self-awareness, and consistent effort in maintaining recovery.   Participation Level:  Did Not Attend  Participation Quality:  N/A  Affect:  N/A  Cognitive:  N/A  Insight: None  Engagement in Group:  None  Modes of Intervention:  N/A  Additional Comments:  Eugene [name] did not attend goals group.  Eugene [name] 05/26/2024, 10:09 AM

## 2024-05-26 NOTE — [facility] Group Notes (Signed)
 Eugene [name] came in late to the recreational therapy/pet therapy group that was today, 05/26/24 from 0945-1030, but he did attend it.
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
## 2024-05-26 NOTE — BHH Group Notes (Signed)
 Eugene [name] did not attend the social work group today, 05/26/24 from 1100-1200.

## 2024-05-26 NOTE — Progress Notes (Signed)
   05/26/24 1400  Psych Admission Type (Psych Patients Only)  Admission Status Voluntary  Psychosocial Assessment  Patient Complaints Substance abuse  Eye Contact Brief  Facial Expression Flat  Affect Irritable  Speech Logical/coherent  Interaction Minimal  Motor Activity Other (Comment)  Appearance/Hygiene Unremarkable  Behavior Characteristics Cooperative  Mood Depressed  Thought Process  Coherency WDL  Content WDL  Delusions None reported or observed  Perception WDL  Hallucination None reported or observed  Judgment WDL  Confusion None  Danger to Self  Current suicidal ideation? Denies  Danger to Others  Danger to Others None reported or observed

## 2024-05-26 NOTE — Group Note (Signed)
 LCSW Group Therapy Note   Group Date: 05/26/2024 Start Time: 1100 End Time: 1200   Participation:  did not attend  Type of Therapy:  Group Therapy   Topic:  Speaking from the Heart: Communicating with Understanding and Empathy  Objective:  To help participants develop effective communication skills to express themselves clearly, listen actively, and navigate conflicts in a healthy way.  Goals: - Increase awareness of verbal and non-verbal communication skills. - Practice using "I" statements and active listening techniques. - Learn coping strategies for managing communication stress.  Summary:  Participants explored the importance of communication, discussed challenges, and practiced skills such as active listening and assertive expression. They reflected on past experiences and identified ways to improve communication in their daily lives.  Therapeutic Modalities: Cognitive-Behavioral Therapy (CBT): Restructuring negative thought patterns in communication. Mindfulness: Staying present and calm during conversations. Psychoeducation: Learning about effective communication techniques.   Crist Kruszka O Cienna Dumais, LCSWA 05/26/2024  12:31 PM

## 2024-05-27 MED ORDER — METFORMIN HCL ER 500 MG PO TB24
500.0000 mg | ORAL_TABLET | Freq: Every day | ORAL | Status: DC
  Administered 2024-05-27: 500 mg via ORAL
  Filled 2024-05-27: qty 1
  Filled 2024-05-27: qty 7

## 2024-05-27 MED ORDER — METFORMIN HCL ER 500 MG PO TB24: 500.0000 mg | ORAL_TABLET | Freq: Every day | ORAL | 2 refills | Status: AC

## 2024-05-27 MED ORDER — LOSARTAN POTASSIUM 50 MG PO TABS: 50.0000 mg | ORAL_TABLET | Freq: Two times a day (BID) | ORAL | 2 refills | Status: AC

## 2024-05-27 MED ORDER — ADULT MULTIVITAMIN W/MINERALS CH: 1.0000 | ORAL_TABLET | Freq: Every day | ORAL | 2 refills | Status: AC

## 2024-05-27 MED ORDER — NALTREXONE HCL 50 MG PO TABS: 50.0000 mg | ORAL_TABLET | Freq: Every day | ORAL | 2 refills | Status: AC

## 2024-05-27 NOTE — Progress Notes (Addendum)
  [facility] Adult Case Management Discharge Plan :  Will you be returning to the same living situation after discharge:  No. Patient will be discharging to the [facility] located at [address]  [place], [place] with bus passes. At discharge, do you have transportation home?: No. CSW arranged taxi voucher through [place] [employer] at 7:15 am.  Do you have the ability to pay for your medications: No. Patient to receive samples from pharmacy upon discharge.  Release of information consent forms completed and in the chart;  Patient's signature needed at discharge.  Patient to Follow up at:  Follow-up Information     [facility]. Go on 06/02/2024.   Specialty: Professional Counselor Why: Please go to this provider on 06/02/24 at 9:00 am for an assessment, to obtain therapy services.  You may also go Monday through Friday, from 9 am to 1 pm for an initial assessment for services. Contact information: [address]  [address] [place] [address] [phone]         [facility]. Go on 06/09/2024.   Specialty: Behavioral Health Why: Please go to this provider on 06/09/24 at 7:00 am for an assessment, to obtain medication management services. You may also go Monday through Friday, arrive by 7:00 am for an initial assessment. Contact information: [address] [place]  [address] [phone]                Next level of care provider has access to [facility] Link:no  Safety Planning and Suicide Prevention discussed: Yes,  completed with patient. Suicide Prevention Education was reviewed thoroughly with patient, including risk factors, warning signs, and what to do. Mobile Crisis services were described and that telephone number pointed out, with encouragement to patient to put this number in personal cell phone. Brochure was provided to patient to share with natural supports. Patient acknowledged the ways in which  they are at risk, and how working through each of their issues can gradually start to reduce their risk factors. Patient was encouraged to think of the information in the context of people in their own lives. Patient denied having access to firearms Patient verbalized understanding of information provided. Patient endorsed a desire to live.      Has patient been referred to the Quitline?: Patient refused referral for treatment  Patient has been referred for addiction treatment: Patient refused referral for treatment.  Eugene [name], LCSWA 05/27/2024, 4:05 PM

## 2024-05-27 NOTE — Progress Notes (Signed)
Pt did not attend NA group 

## 2024-05-27 NOTE — Group Note (Signed)
 Date:  05/27/2024 Time:  10:35 AM  Group Topic/Focus:  Recreation Therapy     Participation Level:  Did Not Attend  Eugene [name] 05/27/2024, 10:35 AM

## 2024-05-27 NOTE — Group Note (Signed)
 Recreation Therapy Group Note   Group Topic:Communication  Group Date: 05/27/2024 Start Time: 0940 End Time: 1025 Facilitators: Nargis Abrams-McCall, LRT,CTRS Location: [address]   Group Topic: Communication, Problem Solving   Goal Area(s) Addresses:  Patient will effectively listen to complete activity.  Patient will identify communication skills used to make activity successful.  Patient will identify how skills used during activity can be used to reach post d/c goals.    Behavioral Response:    Intervention: Building Surveyor Activity - Geometric pattern cards, pencils, blank paper    Activity: Geometric Drawings.  Three volunteers from the peer group will be shown an abstract picture with a particular arrangement of geometrical shapes.  Each round, one 'speaker' will describe the pattern, as accurately as possible without revealing the image to the group.  The remaining group members will listen and draw the picture to reflect how it is described to them. Patients with the role of 'listener' cannot ask clarifying questions but, may request that the speaker repeat a direction. Once the drawings are complete, the presenter will show the rest of the group the picture and compare how close each person came to drawing the picture. LRT will facilitate a post-activity discussion regarding effective communication and the importance of planning, listening, and asking for clarification in daily interactions with others.  Education: Environmental consultant, Active listening, Support systems, Discharge planning  Education Outcome: Acknowledges understanding/In group clarification offered/Needs additional education.    Affect/Mood: N/A   Participation Level: Did not attend    Clinical Observations/Individualized Feedback:      Plan: Continue to engage patient in RT group sessions 2-3x/week.   Corretta Munce-McCall, LRT,CTRS 05/27/2024 12:17 PM

## 2024-05-27 NOTE — Progress Notes (Signed)
 D:  Patient's self inventory sheet, patient sleeps good, no sleep medicine.  Good appetite, normal energy level, good concentration.  Denied depression, hopeless and anxiety.  Denied withdrawals.  Denied SI.  Denied physical problems.  Denied physical pain.  Goal is discharge.  Plans to make arrangements to discharge.  Does have discharge plans. A:  Medications administered per MD orders.  Emotional support and encouragement given patient.   R:  Denied SI and HI, contracts for safety.  Denied A/V hallucinations.  Safety maintained with 15 minute checks.

## 2024-05-27 NOTE — Group Note (Signed)
 Date:  05/27/2024 Time:  9:43 AM  Group Topic/Focus:  Emotional Education:   The focus of this group is to discuss what feelings/emotions are, and how they are experienced. Goals Group:   The focus of this group is to help patients establish daily goals to achieve during treatment and discuss how the patient can incorporate goal setting into their daily lives to aide in recovery.    Participation Level:  Did Not Attend   Eugene [name] [name] 05/27/2024, 9:43 AM

## 2024-05-27 NOTE — Group Note (Signed)
 Date:  05/27/2024 Time:  4:53 PM  Group Topic/Focus:  Dimensions of Wellness:   The focus of this group is to introduce the topic of wellness and discuss the role each dimension of wellness plays in total health.    Participation Level:  Did Not Attend  Participation Quality:       Affect:    Cognitive:    Insight:   Engagement in Group:    Modes of Intervention:    Additional Comments:    Eugene [name] 05/27/2024, 4:53 PM

## 2024-05-27 NOTE — Plan of Care (Signed)

## 2024-05-27 NOTE — BHH Group Notes (Signed)
 Patient did not attend the RN group.
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
## 2024-05-27 NOTE — [facility] Suicide Risk Assessment (Signed)
 Suicide Risk Assessment Early AM 05/28/2024 Discharge Assessment    [facility] Discharge Suicide Risk Assessment   Principal Problem: Alcohol use disorder, severe, dependence (HCC) Discharge Diagnoses: Principal Problem:   Alcohol use disorder, severe, dependence (HCC) Active Problems:   Unspecified mood (affective) disorder   Total Time spent with patient: 45 minutes  Musculoskeletal: Strength & Muscle Tone: within normal limits Gait & Station: normal Patient leans: N/A   Psychiatric Specialty Exam: Mental Status Exam: General Appearance and Behavior: Patient is appropriately obese Caucasian male with well-groomed facial hair and brushed hair,   Orientation:  Full (Time, Place, and Person)  Memory:  Grossly intact  Attention: Good  Eye Contact:  Good  Speech:  Clear and Coherent and Normal Rate  Language:  Good  Volume:  Normal  Mood: I am fine, same as yesterday  Affect:  Angry  Thought Process:  Coherent, Goal Directed, and Linear  Thought Content:  WDL  Suicidal Thoughts:  No  Homicidal Thoughts:  No  Judgement:  Poor  Insight:  Shallow  Psychomotor Activity:  Normal  Akathisia:  No  Fund of Knowledge:  Good Assets:  Communication Skills Desire for Improvement Vocational/Educational  Cognition:  WNL  ADL's:  Intact    Details about paranoia, delusions, or hallucinations:      Sleep  Sleep:No data recorded Estimated Sleeping Duration (Last 24 Hours): 11.00-13.75 hours (Due to Daylight Saving Time, the durations displayed may not accurately represent documentation during the time change interval)  Physical Exam: Vitals and nursing note reviewed.  Constitutional:      General: He is not in acute distress.    Appearance: Normal appearance. He is obese. He is not ill-appearing.  HENT:     Head: Normocephalic and atraumatic.  Pulmonary:     Effort: Pulmonary effort is normal.  Skin:    General: Skin is warm and dry.  Neurological:     General: No focal  deficit present.     Mental Status: He is alert and oriented to person, place, and time.     Sensory: No sensory deficit.  Psychiatric:        Attention and Perception: Attention and perception normal.        Mood and Affect: Mood normal. Affect is blunt.        Speech: Speech normal.        Behavior: Behavior normal.        Thought Content: Thought content is paranoid. Thought content does not include homicidal or suicidal ideation.        Cognition and Memory: Cognition and memory normal.       Blood pressure 139/78, pulse 94, temperature 97.9 F (36.6 C), temperature source Oral, resp. rate 20, height 5' 6 (1.676 m), weight 116.1 kg, SpO2 97%. Body mass index is 41.32 kg/m.  Mental Status Per Nursing Assessment::   On Admission:  Self-harm thoughts  Suicide Risk Assessment:  Suicidal ideation/thoughts:   []  Current  []  Recent  [x]  Denies  []  Remote  Intention to act or plan:        []  Current  []  Recent [x]  Denies  []  Remote  Preparatory behavior:     []  Recent  [x]  Denies []  Remote  Suicide attempts:                []  This admission []  Recent  [x]  Denies   []  Multiple   []  Remote      Risk Factors  Protective Factors  Acute  Escalating substance use, Recent loss (job, relationship, family member), Anger/rage, Sense of purposelessness, Withdrawing from society, Acute insomnia, Recent impulsivity or acting recklessly, Feelings of humiliation, shame, or guilt, Feelings of rejection or abandonment, and Legal problems AcuteSuicideProtectiveFactors: Denies current SI or Intent, No recent suicide attempts, Sense of purpose, and Executive function intact  Chronic Cluster B personality disorder/traits, Poor coping or problem solving skills, History of abuse/trauma, Pattern of impulsivity/recklessness, History of violence, Single, Separated, or Divorced, Lack of social support, Lives alone, Caucasian Race, Male sex, Barriers to accessing healthcare, and Unwillingness to seek help No  previous suicide attempt   Potential future factors: FutureSuicideFactors : Criminal proceedings, Anticipated loss of relationship status, and Expected change in living situation  Summary: While it is impossible to accurately predict with absolute certainty future events and human behaviors, an assessment of current suicidal indicators, risk factors, and protective factors suggests that this patient's:   Acute suicide risk pd:fpoi in degree.   Chronic suicide risk pd:fnizmjuz in degree. Increases with substance/alcohol use and acute intoxication.    Follow-up Information     [facility] Go on 06/02/2024.   Specialty: Professional Counselor Why: Please go to this provider on 06/02/24 at 9:00 am for an assessment, to obtain therapy services.  You may also go Monday through Friday, from 9 am to 1 pm for an initial assessment for services. Contact information: [address]  [address] [place] [address] [phone]         [facility]. Go on 06/09/2024.   Specialty: Behavioral Health Why: Please go to this provider on 06/09/24 at 7:00 am for an assessment, to obtain medication management services. You may also go Monday through Friday, arrive by 7:00 am for an initial assessment. Contact information: [address] [place]  [address] [phone]                Plan Of Care/Follow-up recommendations:  Activity: as tolerated  Diet: heart healthy  Other: -Follow-up with your outpatient psychiatric provider -instructions on appointment date, time, and address (location) are provided to you in discharge paperwork.  -Take your psychiatric medications as prescribed at discharge - instructions are provided to you in the discharge paperwork  -Follow-up with outpatient primary care doctor and other specialists -for management of chronic medical disease, including: elevated hemoglobin A1c indicates pre-diabetes.  Elevated triglycerides indicates elevated cardiovascular risk.  -Placed referral for sleep study. Please attend this appointment.   -Testing: Follow-up with outpatient provider for abnormal lab results: hyponatremia (probably secondary to alcohol use), elevated LDL cholesterol, elevated triglycerides, low HDL, elevated HgbA1c.  -Recommend abstinence from alcohol, tobacco, and other illicit drug use at discharge.   -If your psychiatric symptoms recur, worsen, or if you have side effects to your psychiatric medications, call your outpatient psychiatric provider, 911, [phone] or go to the nearest emergency department.  -If suicidal thoughts recur, call your outpatient psychiatric provider, 911, [phone] or go to the nearest emergency department.   Lynwood Morene Lavone Delsie, MD 05/27/2024, 7:22 PM

## 2024-05-27 NOTE — Transportation (Signed)
 05/27/2024  Eugene [name] DOB: Dec 01, 1975 MRN: [id number]   RIDER WAIVER AND RELEASE OF LIABILITY  For the purposes of helping with transportation needs, [facility] partners with outside transportation providers (taxi companies, [place], catering manager.) to give [place] patients or other approved people the choice of on-demand rides Public Librarian) to our buildings for non-emergency visits.  By using [employer], I, the person signing this document, on behalf of myself and/or any legal minors (in my care using the [employer]), agree:  Science Writer given to me are supplied by independent, outside transportation providers who do not work for, or have any affiliation with, [employer]. [place] is not a transportation company. [facility] has no control over the quality or safety of the rides I get using [employer]. [place] has no control over whether any outside ride will happen on time or not. [facility] gives no guarantee on the reliability, quality, safety, or availability on any rides, or that no mistakes will happen. I know and accept that traveling by vehicle (car, truck, SVU, fleeta, bus, taxi, etc.) has risks of serious injuries such as disability, being paralyzed, and death. I know and agree the risk of using [employer] is mine alone, and not [employer]. [employer] are provided as is and as are available. The transportation providers are in charge for all inspections and care of the vehicles used to provide these rides. I agree not to take legal action against [facility], its agents, employees, officers, directors, representatives, insurers, attorneys, assigns, successors, subsidiaries, and affiliates at any time for any reasons related directly or indirectly to using [employer]. I also agree not to take legal action against  or its affiliates for any injury, death, or damage to property caused by or related to using  [employer]. I have read this Waiver and Release of Liability, and I understand the terms used in it and their legal meaning. This Waiver is freely and voluntarily given with the understanding that my right (or any legal minors) to legal action against  relating to [employer] is knowingly given up to use these services.   I attest that I read the Ride Waiver and Release of Liability to Eugene [name], gave Mr. Trusty the opportunity to ask questions and answered the questions asked (if any). I affirm that Eugene [name] then provided consent for assistance with transportation.
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
## 2024-05-27 NOTE — Plan of Care (Signed)
 Nurse discussed anxiety, depression and coping skills with patient.

## 2024-05-27 NOTE — [facility] Group Notes (Signed)
Patient attended the Pharmacy group. 

## 2024-05-28 NOTE — Progress Notes (Addendum)
(  Sleep Hours) - 12.75  (Any PRNs that were needed, meds refused, or side effects to meds)- None  (Any disturbances and when (visitation, over night)-Patient awake after roommate kept waking him up Stating you are snoring it's my turn to sleep now Support and reassurance provided.   (Concerns raised by the patient)- None  (SI/HI/AVH)- None

## 2024-05-28 NOTE — Progress Notes (Signed)
 Patient alert and oriented x 3 denies SI/HI/A/VH at present verbally contract for safety. Discharged home via [facility] taxi ambulating on steady gait. All belongings returned to Patient and Patient education provided.

## 2024-05-28 NOTE — Plan of Care (Signed)
 Patient adequate for discharge per treatment team.

## 2024-05-28 NOTE — Plan of Care (Signed)
 He reports feeling good and ready to go home.  He reports no side effects to his Naltrexone or Metformin.  He reports his sleep is good.  He reports his appetite is good.  He reports no SI, HI, or AVH.  Discussed with him the importance of taking his medications as prescribed and attending his follow up appointments and he reported understanding.  Discussed with him what to do in the event of a future crisis.  Discussed that he can go to [facility], go to the nearest ED, or call 911 or [phone].   He reported understanding and had no concerns.  He was discharged to the Court.  Marolyn Rosser DO Psychiatrist

## 2024-05-28 NOTE — Plan of Care (Signed)
   Problem: Education: Goal: Mental status will improve Outcome: Progressing Goal: Verbalization of understanding the information provided will improve Outcome: Progressing   Problem: Activity: Goal: Interest or engagement in activities will improve Outcome: Progressing
